# Patient Record
Sex: Female | Born: 2005 | Race: White | Hispanic: No | Marital: Married | Smoking: Never smoker
Health system: Southern US, Community
[De-identification: ages and names within clinical notes are randomized; demographics above are authoritative.]

## PROBLEM LIST (undated history)

## (undated) DIAGNOSIS — J45909 Unspecified asthma, uncomplicated: Secondary | ICD-10-CM

## (undated) DIAGNOSIS — M419 Scoliosis, unspecified: Secondary | ICD-10-CM

## (undated) DIAGNOSIS — H9190 Unspecified hearing loss, unspecified ear: Secondary | ICD-10-CM

## (undated) DIAGNOSIS — H6690 Otitis media, unspecified, unspecified ear: Secondary | ICD-10-CM

## (undated) DIAGNOSIS — IMO0001 Reserved for inherently not codable concepts without codable children: Secondary | ICD-10-CM

## (undated) DIAGNOSIS — H1010 Acute atopic conjunctivitis, unspecified eye: Secondary | ICD-10-CM

## (undated) DIAGNOSIS — O139 Gestational [pregnancy-induced] hypertension without significant proteinuria, unspecified trimester: Secondary | ICD-10-CM

## (undated) DIAGNOSIS — E039 Hypothyroidism, unspecified: Secondary | ICD-10-CM

## (undated) HISTORY — PX: TONSILLECTOMY: SUR1361

## (undated) HISTORY — PX: ADENOIDECTOMY: SUR15

## (undated) HISTORY — DX: Gestational (pregnancy-induced) hypertension without significant proteinuria, unspecified trimester: O13.9

## (undated) HISTORY — DX: Acute atopic conjunctivitis, unspecified eye: H10.10

---
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
## 2023-01-20 ENCOUNTER — Ambulatory Visit
Admission: EM | Admit: 2023-01-20 | Discharge: 2023-01-20 | Disposition: A | Discharge status: Home / Self Care | Payer: Medicaid Other | Attending: Physician Assistant | Admitting: Physician Assistant

## 2023-01-20 DIAGNOSIS — J039 Acute tonsillitis, unspecified: Secondary | ICD-10-CM

## 2023-01-20 DIAGNOSIS — J069 Acute upper respiratory infection, unspecified: Secondary | ICD-10-CM | POA: Diagnosis not present

## 2023-01-20 DIAGNOSIS — R040 Epistaxis: Secondary | ICD-10-CM | POA: Diagnosis not present

## 2023-01-20 MED ORDER — PREDNISONE 20 MG PO TABS: 40.0000 mg | ORAL_TABLET | Freq: Every day | ORAL | 0 refills | Status: AC

## 2023-01-20 NOTE — ED Triage Notes (Signed)
Pt presents with nasal drainage, non productive cough,sore throat, and intermittent nose bleeds X 2 days.

## 2023-01-21 ENCOUNTER — Encounter: Payer: Self-pay | Admitting: Physician Assistant

## 2023-01-21 NOTE — ED Provider Notes (Signed)
Shannon [name]    CSN: [id number] Arrival date & time: 01/20/23  1732      History   Chief Complaint Chief Complaint  Patient presents with   URI   Epistaxis    HPI Shannon [name] is a 17 y.o. female.   Patient here today for evaluation of nasal congestion and drainage, nonproductive cough, sore throat and intermittent nosebleeds that have been ongoing the last 2 days.  She reports she has had about 3 nosebleeds that have lasted about 5 minutes each.  She does not report fever.  She has not any vomiting or diarrhea.  She has tried over-the-counter medication without resolution of symptoms.  The history is provided by the patient.  URI Presenting symptoms: congestion, cough and sore throat   Presenting symptoms: no ear pain and no fever   Associated symptoms: no wheezing   Epistaxis Associated symptoms: congestion, cough and sore throat   Associated symptoms: no fever     Past Medical History:  Diagnosis Date   Allergic conjunctivitis 02/23/2018   Asthma    Hard of hearing    Hearing loss    Hypothyroidism    Impaired hearing    Otitis    Scoliosis     Patient Active Problem List   Diagnosis Date Noted   Visual changes 11/15/2020   Moderate headache 11/15/2020   Vacuum-assisted vaginal delivery 11/16 11/07/2020   Hypokalemia 11/07/2020   Anemia, iron deficiency 11/07/2020   Postpartum [name] following VAVD - 11/16 11/07/2020   Preeclampsia 11/06/2020   Mild persistent asthma 02/23/2018   Hearing impaired 12/09/2014    Past Surgical History:  Procedure Laterality Date   ADENOIDECTOMY     INCISION AND DRAINAGE ABSCESS Left 08/18/2014   Procedure: INCISION AND DRAINAGE ABSCESS - Left Arm;  Surgeon: Jerilynn Mages. Gerald Stabs, MD;  Location: [facility] [facility];  Service: Pediatrics;  Laterality: Left;   TONSILLECTOMY      OB History     Gravida  2   Para  1   Term  1   Preterm  0   AB  0   Living  1      SAB  0   IAB  0   Ectopic  0   Multiple       Live Births  1            Home Medications    Prior to Admission medications   Medication Sig Start Date End Date Taking? Authorizing Provider  predniSONE (DELTASONE) 20 MG tablet Take 2 tablets (40 mg total) by mouth daily with breakfast for 5 days. 01/20/23 01/25/23 Yes Francene Finders, PA-C  cyclobenzaprine (FLEXERIL) 5 MG tablet Take 5 mg by mouth 3 (three) times daily as needed. Patient not taking: Reported on 08/26/2022 05/13/22   [provider]  gabapentin (NEURONTIN) 300 MG capsule Take 300 mg by mouth 2 (two) times daily. 04/25/22   [provider]  hydrOXYzine (VISTARIL) 25 MG capsule Take 25 mg by mouth at bedtime as needed for anxiety (sleep). 07/29/22   [provider]  ibuprofen (ADVIL) 200 MG tablet Take 400 mg by mouth as needed (pain).    [provider]  levothyroxine (SYNTHROID) 50 MCG tablet Take 50 mcg by mouth daily. Patient not taking: Reported on 08/26/2022 06/03/22   [provider]  lidocaine (XYLOCAINE) 2 % solution Use as directed 15 mLs in the mouth or throat as needed for mouth pain. 11/10/22   Rising, Wells Guiles, PA-C  Multiple Vitamins-Minerals (MULTIVITAMIN WITH MINERALS) tablet Take 1 tablet by mouth daily.    [provider]  naproxen (NAPROSYN) 375 MG tablet Take 375 mg by mouth 2 (two) times daily as needed (pain). 06/28/22   [provider]  potassium chloride (KLOR-CON) 10 MEQ tablet Take 1 tablet (10 mEq total) by mouth daily. Patient not taking: Reported on 08/26/2022 10/22/20   Nugent, Gerrie Nordmann, NP  tiZANidine (ZANAFLEX) 2 MG tablet Take 2-4 mg by mouth every 8 (eight) hours as needed. Patient not taking: Reported on 08/26/2022 07/15/22   [provider]    Family History Family History  Problem Relation Age of Onset   Migraines Mother    Anxiety disorder Mother    Depression Mother    Migraines Brother    Autism Brother    ADD / ADHD Neg Hx    Bipolar disorder Neg Hx     Schizophrenia Neg Hx    Diabetes Maternal Grandmother    Hypertension Maternal Grandmother    Heart disease Maternal Grandmother    Learning disabilities Brother    Asthma Brother    Allergic rhinitis Brother    Learning disabilities Maternal Uncle    Mental illness Maternal Uncle    Mental retardation Maternal Uncle    Healthy Mother    Healthy Father    Eczema Neg Hx     Social History Social History   Tobacco Use   Smoking status: Never    Passive exposure: Yes   Smokeless tobacco: Never   Tobacco comments:    mom and step dad smokes outside  Vaping Use   Vaping Use: Never used  Substance Use Topics   Alcohol use: Never   Drug use: Never     Allergies   Eggs or egg-derived products   Review of Systems Review of Systems  Constitutional:  Negative for chills and fever.  HENT:  Positive for congestion, nosebleeds and sore throat. Negative for ear pain.   Eyes:  Negative for discharge and redness.  Respiratory:  Positive for cough. Negative for shortness of breath and wheezing.   Gastrointestinal:  Negative for abdominal pain, diarrhea, nausea and vomiting.     Physical Exam Triage Vital Signs ED Triage Vitals  Enc Vitals Group     BP 01/20/23 1821 110/78     Pulse Rate 01/20/23 1821 92     Resp 01/20/23 1821 17     Temp 01/20/23 1821 98.2 F (36.8 C)     Temp Source 01/20/23 1821 Oral     SpO2 01/20/23 1821 97 %     Weight --      Height --      Head Circumference --      Peak Flow --      Pain Score 01/20/23 1820 5     Pain Loc --      Pain Edu? --      Excl. in [place]? --    No data found.  Updated Vital Signs BP 110/78 (BP Location: Left Arm)   Pulse 92   Temp 98.2 F (36.8 C) (Oral)   Resp 17   SpO2 97%      Physical Exam Vitals and nursing note reviewed.  Constitutional:      General: She is not in acute distress.    Appearance: Normal appearance. She is not ill-appearing.  HENT:     Head: Normocephalic and atraumatic.     Nose:  Congestion present.     Mouth/Throat:  Mouth: Mucous membranes are moist.     Pharynx: Posterior oropharyngeal erythema present. No oropharyngeal exudate.     Tonsils: 3+ on the right. 3+ on the left.  Eyes:     Conjunctiva/sclera: Conjunctivae normal.  Cardiovascular:     Rate and Rhythm: Normal rate and regular rhythm.     Heart sounds: Normal heart sounds. No murmur heard. Pulmonary:     Effort: Pulmonary effort is normal. No respiratory distress.     Breath sounds: Normal breath sounds. No wheezing, rhonchi or rales.  Skin:    General: Skin is warm and dry.  Neurological:     Mental Status: She is alert.  Psychiatric:        Mood and Affect: Mood normal.        Thought Content: Thought content normal.      UC Treatments / Results  Labs (all labs ordered are listed, but only abnormal results are displayed) Labs Reviewed - No data to display  EKG   Radiology No results found.  Procedures Procedures (including critical [name] time)  Medications Ordered in UC Medications - No data to display  Initial Impression / Assessment and Plan / UC Course  I have reviewed the triage vital signs and the nursing notes.  Pertinent labs & imaging results that were available during my [name] of the patient were reviewed by me and considered in my medical decision making (see chart for details).    Suspect likely viral etiology of symptoms.  Tonsils are relatively large on exam, will treat with steroid burst to hopefully decrease inflammation and improve symptoms.  Discussed that that nosebleeds are likely due to sinus issues, seemingly benign at this time, recommended follow-up if they persist or worsen.  Encouraged symptomatic treatment otherwise for symptoms.  Final Clinical Impressions(s) / UC Diagnoses   Final diagnoses:  Acute upper respiratory infection  Acute tonsillitis, unspecified etiology  Epistaxis   Discharge Instructions   None    ED Prescriptions      Medication Sig Dispense Auth. Provider   predniSONE (DELTASONE) 20 MG tablet Take 2 tablets (40 mg total) by mouth daily with breakfast for 5 days. 10 tablet Francene Finders, PA-C      PDMP not reviewed this encounter.   Francene Finders, PA-C 01/21/23 [id number]

## 2023-04-09 IMAGING — CR DG CHEST 2V
2 series · 2 of 2 positions shown · non-contrast
Comparison: 08/28/2018

CLINICAL DATA: Chest pain and difficulty breathing

EXAM:
CHEST - 2 VIEW

[w chest pa]
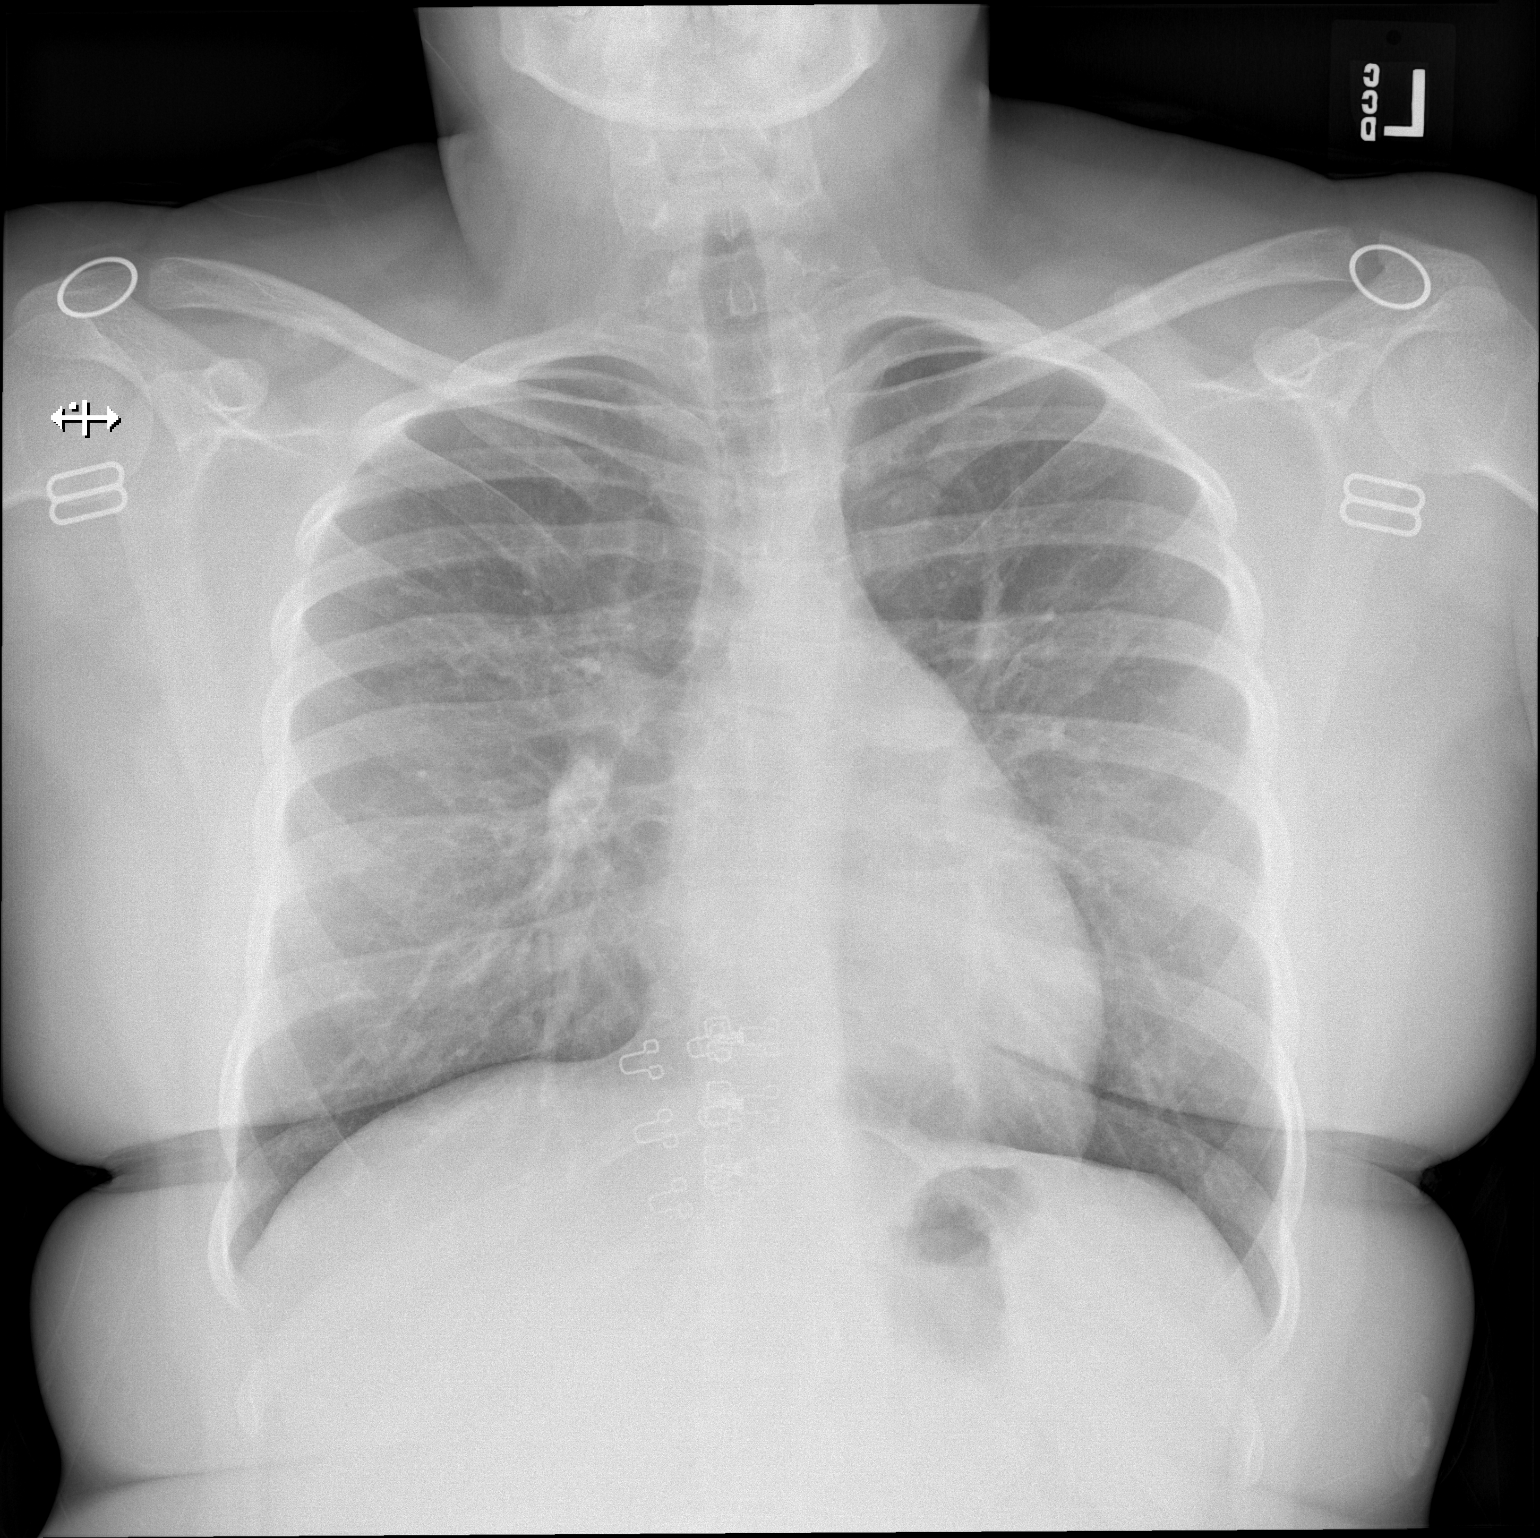

[w chest lat]
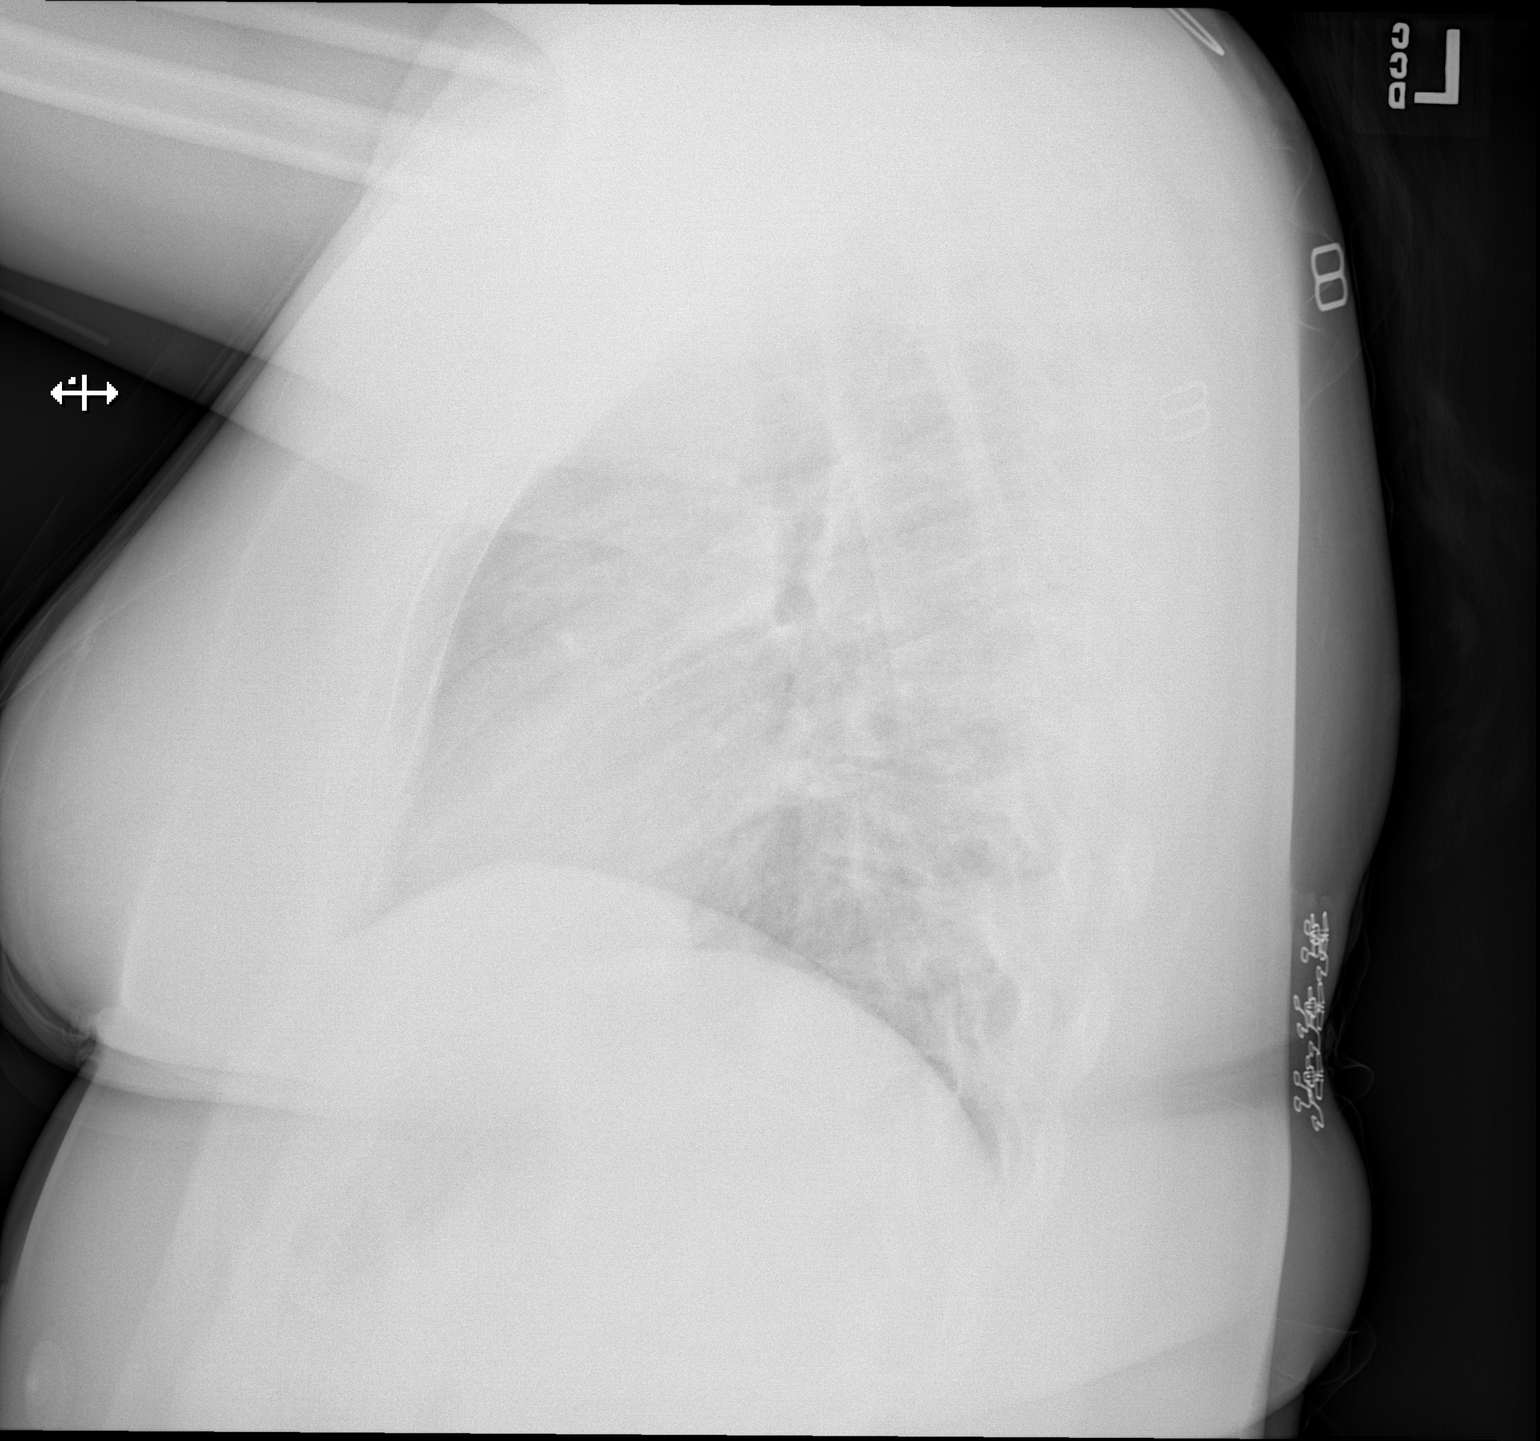

[2 of 2 positions shown; findings below may reference images not displayed]

FINDINGS: Artifact from EKG pads.

Normal heart size and mediastinal contours. No acute infiltrate or
edema. No effusion or pneumothorax. No acute osseous findings.
IMPRESSION: No active cardiopulmonary disease.

## 2023-05-02 ENCOUNTER — Encounter (HOSPITAL_BASED_OUTPATIENT_CLINIC_OR_DEPARTMENT_OTHER): Payer: Self-pay

## 2023-05-02 ENCOUNTER — Other Ambulatory Visit: Payer: Self-pay

## 2023-05-02 ENCOUNTER — Emergency Department (HOSPITAL_BASED_OUTPATIENT_CLINIC_OR_DEPARTMENT_OTHER): Payer: Medicaid Other

## 2023-05-02 ENCOUNTER — Emergency Department (HOSPITAL_BASED_OUTPATIENT_CLINIC_OR_DEPARTMENT_OTHER)
Admission: EM | Admit: 2023-05-02 | Discharge: 2023-05-02 | Disposition: A | Discharge status: Home / Self Care | Payer: Medicaid Other | Attending: Emergency Medicine | Admitting: Emergency Medicine

## 2023-05-02 DIAGNOSIS — S91209A Unspecified open wound of unspecified toe(s) with damage to nail, initial encounter: Secondary | ICD-10-CM | POA: Diagnosis not present

## 2023-05-02 DIAGNOSIS — Y9344 Activity, trampolining: Secondary | ICD-10-CM | POA: Insufficient documentation

## 2023-05-02 DIAGNOSIS — X501XXA Overexertion from prolonged static or awkward postures, initial encounter: Secondary | ICD-10-CM | POA: Insufficient documentation

## 2023-05-02 DIAGNOSIS — M79675 Pain in left toe(s): Secondary | ICD-10-CM | POA: Diagnosis present

## 2023-05-02 LAB — PREGNANCY, URINE: Preg Test, Ur: NEGATIVE

## 2023-05-02 MED ORDER — CEPHALEXIN 250 MG PO CAPS
500.0000 mg | ORAL_CAPSULE | Freq: Once | ORAL | Status: AC
  Administered 2023-05-02: 500 mg via ORAL
  Filled 2023-05-02: qty 2

## 2023-05-02 MED ORDER — CEPHALEXIN 500 MG PO CAPS: 500.0000 mg | ORAL_CAPSULE | Freq: Two times a day (BID) | ORAL | 0 refills | Status: DC

## 2023-05-02 NOTE — ED Provider Notes (Signed)
Shannon [name] Provider Note   CSN: [id number] Arrival date & time: 05/02/23  1610     History  Chief Complaint  Patient presents with   Toe Pain   Possible Pregnancy    [name] is a 17 y.o. female, no pertinent past medical history, who presents to the ED secondary to a left toe injury after jumping on trampoline, and pulling her toenail.  She states that her toe hurts a lot, and notes that the return is completely ripped off.  Up-to-date on immunizations.  Also states that she may be pregnant and would like to know.  Denies any kind of head trauma, neck trauma, or other pain.     Home Medications Prior to Admission medications   Medication Sig Start Date End Date Taking? Authorizing Provider  cephALEXin (KEFLEX) 500 MG capsule Take 1 capsule (500 mg total) by mouth 2 (two) times daily. 05/02/23  Yes Khalia Gong L, PA  cyclobenzaprine (FLEXERIL) 5 MG tablet Take 5 mg by mouth 3 (three) times daily as needed. Patient not taking: Reported on 08/26/2022 05/13/22   [provider]  gabapentin (NEURONTIN) 300 MG capsule Take 300 mg by mouth 2 (two) times daily. 04/25/22   [provider]  hydrOXYzine (VISTARIL) 25 MG capsule Take 25 mg by mouth at bedtime as needed for anxiety (sleep). 07/29/22   [provider]  ibuprofen (ADVIL) 200 MG tablet Take 400 mg by mouth as needed (pain).    [provider]  levothyroxine (SYNTHROID) 50 MCG tablet Take 50 mcg by mouth daily. Patient not taking: Reported on 08/26/2022 06/03/22   [provider]  lidocaine (XYLOCAINE) 2 % solution Use as directed 15 mLs in the mouth or throat as needed for mouth pain. 11/10/22   Rising, Lurena Joiner, PA-C  Multiple Vitamins-Minerals (MULTIVITAMIN WITH MINERALS) tablet Take 1 tablet by mouth daily.    [provider]  naproxen (NAPROSYN) 375 MG tablet Take 375 mg by mouth 2 (two) times daily as needed (pain). 06/28/22    [provider]  potassium chloride (KLOR-CON) 10 MEQ tablet Take 1 tablet (10 mEq total) by mouth daily. Patient not taking: Reported on 08/26/2022 10/22/20   Nugent, Odie Sera, NP  tiZANidine (ZANAFLEX) 2 MG tablet Take 2-4 mg by mouth every 8 (eight) hours as needed. Patient not taking: Reported on 08/26/2022 07/15/22   [provider]      Allergies    Egg-derived products    Review of Systems   Review of Systems  Musculoskeletal:  Negative for neck pain.       +L great toe pain    Physical Exam Updated Vital Signs BP (!) 147/92 (BP Location: Right Arm)   Pulse 93   Temp 98.5 F (36.9 C) (Oral)   Resp 18   Ht 4\' 9"  (1.448 m)   Wt 68 kg   SpO2 98%   BMI 32.46 kg/m  Physical Exam Vitals and nursing note reviewed.  Constitutional:      General: She is not in acute distress.    Appearance: She is well-developed.  HENT:     Head: Normocephalic and atraumatic.  Eyes:     Conjunctiva/sclera: Conjunctivae normal.  Cardiovascular:     Rate and Rhythm: Normal rate and regular rhythm.     Heart sounds: No murmur heard. Pulmonary:     Effort: Pulmonary effort is normal. No respiratory distress.     Breath sounds: Normal breath sounds.  Abdominal:     Palpations: Abdomen is soft.     Tenderness: There is no abdominal tenderness.  Musculoskeletal:        General: No swelling.     Cervical back: Neck supple.     Comments: Left foot: ttp of DIP of L great toe. No edema. Able to bear weight. No midfoot or base of 5th metatarsal tenderness to palpation.  DIPs, PIPs intact.  Capillary refill <2sec. Dorsalis pedis pulse present. No foot drop noted. Sensation intact. Warm to touch.    Skin:    General: Skin is warm and dry.     Capillary Refill: Capillary refill takes less than 2 seconds.     Comments: No evidence of nail on right great toe  Neurological:     Mental Status: She is alert.  Psychiatric:        Mood and Affect: Mood normal.     ED Results /  Procedures / Treatments   Labs (all labs ordered are listed, but only abnormal results are displayed) Labs Reviewed  PREGNANCY, URINE    EKG None  Radiology DG Toe Great Left  Result Date: 05/02/2023 CLINICAL DATA:  Injury to the left great toe 2 days ago EXAM: LEFT GREAT TOE COMPARISON:  None Available. FINDINGS: There is no evidence of fracture or dislocation. There is no evidence of arthropathy or other focal bone abnormality. Soft tissues are unremarkable. IMPRESSION: No acute fracture or dislocation. Electronically Signed   By: Agustin Cree M.D.   On: 05/02/2023 17:59    Procedures Procedures   Medications Ordered in ED Medications  cephALEXin (KEFLEX) capsule 500 mg (500 mg Oral Given 05/02/23 1709)    ED Course/ Medical Decision Making/ A&P                             Medical Decision Making Discussed with patient, she has no nail on her right great toe, we discussed possibly putting something underneath the nail, to help encourage it to grow back, and she declined.  She understands that her nail may not grow back, and is in agreement with plan.  She will need to be on some type of Keflex, to help with infection, and we discussed dressings.  She is requesting an x-ray to be completed of her toe, for further evaluation x-ray ordered.  No head trauma, or other complaints.  Pregnancy test is negative  Amount and/or Complexity of Data Reviewed Labs: ordered.    Details: Negative pregnancy test Radiology: ordered.    Details: X-ray unremarkable Discussion of management or test interpretation with external provider(s): Discussed with patient, x-ray unremarkable, we discussed possible treatment of the nail, and putting something underneath the nail also to promote it to grow back, she declined.  We will put her on Keflex for 10 days, to help with prevention of infection, we discussed infectious control, and return if there is development of redness, swelling, worsening pain.  X-ray  unremarkable.  Discharged home.  Negative pregnancy test.  No other trauma.  Risk Prescription drug management.   Final Clinical Impression(s) / ED Diagnoses Final diagnoses:  Toe pain, left  Avulsion of toenail, initial encounter    Rx / DC Orders ED Discharge Orders          Ordered    cephALEXin (KEFLEX) 500 MG capsule  2 times daily        05/02/23 1818  Pete Pelt, [place] 05/02/23 Katherine Basset, MD 05/02/23 [phone]

## 2023-05-02 NOTE — Discharge Instructions (Addendum)
Please take the antibiotic as prescribed, this will help prevent infection of the nail.  Keep the area clean and dry, and make sure you bandage it, with gauze, and tape.  If you do you develop redness, swelling, pain, that is worsening please return to the ER.  You can use Tylenol and ibuprofen for your pain control.

## 2023-05-02 NOTE — ED Triage Notes (Signed)
Pt states that she thinks she "messed up" her L big toe. Pt states that she was jumping on the trampoline and her toe bent back and her toenail popped off. Pt also states that she thinks she may be pregnant.

## 2023-06-19 ENCOUNTER — Ambulatory Visit (INDEPENDENT_AMBULATORY_CARE_PROVIDER_SITE_OTHER): Payer: Medicaid Other | Admitting: *Deleted

## 2023-06-19 ENCOUNTER — Other Ambulatory Visit (INDEPENDENT_AMBULATORY_CARE_PROVIDER_SITE_OTHER): Payer: Medicaid Other

## 2023-06-19 ENCOUNTER — Other Ambulatory Visit (HOSPITAL_COMMUNITY)
Admission: RE | Admit: 2023-06-19 | Discharge: 2023-06-19 | Disposition: A | Discharge status: Home / Self Care | Payer: Medicaid Other | Source: Ambulatory Visit | Attending: Obstetrics & Gynecology | Admitting: Obstetrics & Gynecology

## 2023-06-19 VITALS — BP 122/84 | HR 74 | Wt 153.6 lb

## 2023-06-19 DIAGNOSIS — Z3481 Encounter for supervision of other normal pregnancy, first trimester: Secondary | ICD-10-CM

## 2023-06-19 DIAGNOSIS — Z3480 Encounter for supervision of other normal pregnancy, unspecified trimester: Secondary | ICD-10-CM | POA: Insufficient documentation

## 2023-06-19 DIAGNOSIS — O3680X0 Pregnancy with inconclusive fetal viability, not applicable or unspecified: Secondary | ICD-10-CM

## 2023-06-19 DIAGNOSIS — Z3A09 9 weeks gestation of pregnancy: Secondary | ICD-10-CM | POA: Diagnosis not present

## 2023-06-19 DIAGNOSIS — Z1339 Encounter for screening examination for other mental health and behavioral disorders: Secondary | ICD-10-CM

## 2023-06-19 DIAGNOSIS — Z3A10 10 weeks gestation of pregnancy: Secondary | ICD-10-CM

## 2023-06-19 MED ORDER — PROMETHAZINE HCL 25 MG PO TABS: 25.0000 mg | ORAL_TABLET | Freq: Four times a day (QID) | ORAL | 1 refills | Status: DC | PRN

## 2023-06-19 NOTE — Progress Notes (Signed)
New OB Intake  I connected with Shannon [name]  on 06/19/23 at  1:10 PM EDT by In Person Visit and verified that I am speaking with the correct person using two identifiers. Nurse is located at [facility] and pt is located at [place].  I discussed the limitations, risks, security and privacy concerns of performing an evaluation and management service by telephone and the availability of in person appointments. I also discussed with the patient that there may be a patient responsible charge related to this service. The patient expressed understanding and agreed to proceed.  I explained I am completing New OB Intake today. We discussed EDD of 01/11/2024, by Last Menstrual Period. Pt is G2P1001. I reviewed her allergies, medications and Medical/Surgical/OB history.    Patient Active Problem List   Diagnosis Date Noted   Visual changes 11/15/2020   Moderate headache 11/15/2020   Vacuum-assisted vaginal delivery 11/16 11/07/2020   Hypokalemia 11/07/2020   Anemia, iron deficiency 11/07/2020   Postpartum care following VAVD - 11/16 11/07/2020   Preeclampsia 11/06/2020   Mild persistent asthma 02/23/2018   Hearing impaired 12/09/2014    Concerns addressed today  Delivery Plans Plans to deliver at [facility]. Discussed the nature of our practice with multiple providers including residents and students. Due to the size of the practice, the delivering provider may not be the same as those providing prenatal care.   Patient is interested in water birth. Offered upcoming OB visit with CNM to discuss further.  MyChart/Babyscripts MyChart access verified. I explained pt will have some visits in office and some virtually. Babyscripts instructions given and order placed. Patient verifies receipt of registration text/e-mail. Account successfully created and app downloaded.  Blood Pressure Cuff/Weight Scale Blood pressure cuff ordered for patient to pick-up from [employer]. Explained after first  prenatal appt pt will check weekly and document in Babyscripts. Patient does not have weight scale; patient may purchase if they desire to track weight weekly in Babyscripts.  Anatomy US Explained first scheduled [place] will be around 19 weeks. Anatomy US scheduled for TBD at TBD.  Interested in [place]? If yes, send referral and doula dot phrase.    First visit review I reviewed new OB appt with patient. Explained pt will be seen by Dr. Donavan Foil at first visit. Discussed Avelina Laine genetic screening with patient. Requests Panorama and Horizon.. Routine prenatal labs  collected at today's visit.    Last Pap No results found for: "DIAGPAP"  Shannon Lemon, RN 06/19/2023  1:43 PM

## 2023-06-19 NOTE — Patient Instructions (Signed)
[facility] has a partnership with the [employer] to provide prenatal navigation for the most needed resources in our community. In order to see how we can help connect you to these resources we need consent to contact you. Please complete the very short consent using the link below:   English Link: https://[email or web address].[email or web address]?[email or web address]  Spanish Link: https://[email or web address].tfaforms.[email or web address]?site=16   Options for Doula Care in the [place] Area  As you review your birthing options, consider having a birth doula. A doula is trained to provide support before, during and just after you give birth. There are also postpartum doulas that help you adjust to new parenthood.  While doulas do not provide medical care, they do provide emotional, physical and educational support. A few months before your baby arrives, doulas can help answer questions, ease concerns and help you create and support your birthing plan.    Doulas can help reduce your stress and comfort you and your partner. They can help you cope with labor by helping you use breathing techniques, massage, creative labor positioning, essential oils and affirmations.   Studies show that the benefits of having a doula include:   A more positive birth experience  Fewer requests for pain-relief medication  Less likelihood of cesarean section, commonly called a c-section   Doulas are typically hired via a fee and contract between you and the doula. We are happy to provide a list of the most active doulas in the area, all of whom are credentialed by [facility] and will not count as a visitor at your birth.  There are several options for no-cost doula care at our hospital, including:  [facility] Volunteer Doula Program Every Baby [place] Doula Program A Cure 4 Moms Doula Study (available only at [facility], [facility], [place] and [place] [facility] offices)  For more information on these programs or to receive  a list of doulas active in our area, please email [email or web address]         

## 2023-06-20 LAB — CBC/D/PLT+RPR+RH+ABO+RUBIGG...
Antibody Screen: NEGATIVE
Basophils Absolute: 0 10*3/uL (ref 0.0–0.2)
Basos: 0 %
EOS (ABSOLUTE): 0.1 10*3/uL (ref 0.0–0.4)
Eos: 1 %
HCV Ab: NONREACTIVE
HIV Screen 4th Generation wRfx: NONREACTIVE
Hematocrit: 39.6 % (ref 34.0–46.6)
Hemoglobin: 13.6 g/dL (ref 11.1–15.9)
Hepatitis B Surface Ag: NEGATIVE
Immature Grans (Abs): 0 10*3/uL (ref 0.0–0.1)
Immature Granulocytes: 0 %
Lymphocytes Absolute: 2 10*3/uL (ref 0.7–3.1)
Lymphs: 26 %
MCH: 29.2 pg (ref 26.6–33.0)
MCHC: 34.3 g/dL (ref 31.5–35.7)
MCV: 85 fL (ref 79–97)
Monocytes Absolute: 0.6 10*3/uL (ref 0.1–0.9)
Monocytes: 8 %
Neutrophils Absolute: 4.8 10*3/uL (ref 1.4–7.0)
Neutrophils: 65 %
Platelets: 259 10*3/uL (ref 150–450)
RBC: 4.66 x10E6/uL (ref 3.77–5.28)
RDW: 14.1 % (ref 11.7–15.4)
RPR Ser Ql: NONREACTIVE
Rh Factor: POSITIVE
Rubella Antibodies, IGG: 9.56 index (ref >0.99)
WBC: 7.5 10*3/uL (ref 3.4–10.8)

## 2023-06-20 LAB — COMPREHENSIVE METABOLIC PANEL
ALT: 10 IU/L (ref 0–32)
AST: 13 IU/L (ref 0–40)
Albumin: 4.6 g/dL (ref 4.0–5.0)
Alkaline Phosphatase: 94 IU/L (ref 42–106)
BUN/Creatinine Ratio: 21 (ref 9–23)
BUN: 9 mg/dL (ref 6–20)
Bilirubin Total: <0.2 mg/dL (ref 0.0–1.2)
CO2: 20 mmol/L (ref 20–29)
Calcium: 9.5 mg/dL (ref 8.7–10.2)
Chloride: 101 mmol/L (ref 96–106)
Creatinine, Ser: 0.42 mg/dL — ABNORMAL LOW (ref 0.57–1.00)
Globulin, Total: 2.9 g/dL (ref 1.5–4.5)
Glucose: 74 mg/dL (ref 70–99)
Potassium: 4 mmol/L (ref 3.5–5.2)
Sodium: 137 mmol/L (ref 134–144)
Total Protein: 7.5 g/dL (ref 6.0–8.5)
eGFR: 145 mL/min/{1.73_m2} (ref >59)

## 2023-06-20 LAB — HEMOGLOBIN A1C
Est. average glucose Bld gHb Est-mCnc: 97 mg/dL
Hgb A1c MFr Bld: 5 % (ref 4.8–5.6)

## 2023-06-20 LAB — CERVICOVAGINAL ANCILLARY ONLY
Chlamydia: NEGATIVE
Comment: NEGATIVE
Comment: NORMAL
Neisseria Gonorrhea: NEGATIVE

## 2023-06-20 LAB — HCV INTERPRETATION

## 2023-06-21 LAB — CULTURE, OB URINE

## 2023-06-21 LAB — URINE CULTURE, OB REFLEX

## 2023-06-26 LAB — PANORAMA PRENATAL TEST FULL PANEL:PANORAMA TEST PLUS 5 ADDITIONAL MICRODELETIONS: FETAL FRACTION: 8

## 2023-07-03 ENCOUNTER — Ambulatory Visit (INDEPENDENT_AMBULATORY_CARE_PROVIDER_SITE_OTHER): Payer: Medicaid Other | Admitting: Licensed Clinical Social Worker

## 2023-07-03 ENCOUNTER — Ambulatory Visit (INDEPENDENT_AMBULATORY_CARE_PROVIDER_SITE_OTHER): Payer: Medicaid Other | Admitting: Obstetrics and Gynecology

## 2023-07-03 VITALS — BP 114/78 | HR 89 | Wt 151.0 lb

## 2023-07-03 DIAGNOSIS — M549 Dorsalgia, unspecified: Secondary | ICD-10-CM

## 2023-07-03 DIAGNOSIS — O99340 Other mental disorders complicating pregnancy, unspecified trimester: Secondary | ICD-10-CM

## 2023-07-03 DIAGNOSIS — E039 Hypothyroidism, unspecified: Secondary | ICD-10-CM | POA: Diagnosis not present

## 2023-07-03 DIAGNOSIS — O99891 Other specified diseases and conditions complicating pregnancy: Secondary | ICD-10-CM

## 2023-07-03 DIAGNOSIS — O09299 Supervision of pregnancy with other poor reproductive or obstetric history, unspecified trimester: Secondary | ICD-10-CM | POA: Insufficient documentation

## 2023-07-03 DIAGNOSIS — O09291 Supervision of pregnancy with other poor reproductive or obstetric history, first trimester: Secondary | ICD-10-CM | POA: Diagnosis not present

## 2023-07-03 DIAGNOSIS — F419 Anxiety disorder, unspecified: Secondary | ICD-10-CM

## 2023-07-03 DIAGNOSIS — Z3A12 12 weeks gestation of pregnancy: Secondary | ICD-10-CM

## 2023-07-03 DIAGNOSIS — J4530 Mild persistent asthma, uncomplicated: Secondary | ICD-10-CM

## 2023-07-03 DIAGNOSIS — Z3480 Encounter for supervision of other normal pregnancy, unspecified trimester: Secondary | ICD-10-CM

## 2023-07-03 MED ORDER — CYCLOBENZAPRINE HCL 5 MG PO TABS: 5.0000 mg | ORAL_TABLET | Freq: Three times a day (TID) | ORAL | 1 refills | Status: DC | PRN

## 2023-07-03 MED ORDER — ASPIRIN 81 MG PO CHEW: 81.0000 mg | CHEWABLE_TABLET | Freq: Every day | ORAL | 5 refills | Status: DC

## 2023-07-03 NOTE — Progress Notes (Signed)
INITIAL PRENATAL VISIT NOTE  Subjective:  Shannon [name] is a 17 y.o. G2P1001 at [redacted]w[redacted]d by LMP being seen today for her initial prenatal visit. She has an obstetric history significant for Vacuum assisted vaginal delivery and preeclampsia. She has a medical history significant for hypothyroid.  Patient reports  chronic back pain from scoliosis .  Contractions: Not present. Vag. Bleeding: None.   . Denies leaking of fluid.    Past Medical History:  Diagnosis Date   Allergic conjunctivitis 02/23/2018   Asthma    Hard of hearing    Hearing loss    Hypothyroidism    Impaired hearing    Otitis    Scoliosis     Past Surgical History:  Procedure Laterality Date   ADENOIDECTOMY     INCISION AND DRAINAGE ABSCESS Left 08/18/2014   Procedure: INCISION AND DRAINAGE ABSCESS - Left Arm;  Surgeon: Judie Petit. [name], MD;  Location: [facility] OR;  Service: Pediatrics;  Laterality: Left;   TONSILLECTOMY      OB History  Gravida Para Term Preterm AB Living  2 1 1  0 0 1  SAB IAB Ectopic Multiple Live Births  0 0 0   1    # Outcome Date GA Lbr Len/2nd Weight Sex Type Anes PTL Lv  2 Current           1 Term 11/07/20 [redacted]w[redacted]d / 06:28 6 lb 8.9 oz (2.975 kg) F Vag-Vacuum EPI  LIV    Social History   Socioeconomic History   Marital status: Married    Spouse name: Not on file   Number of children: Not on file   Years of education: Not on file   Highest education level: Not on file  Occupational History   Not on file  Tobacco Use   Smoking status: Never    Passive exposure: Yes   Smokeless tobacco: Never   Tobacco comments:    mom and step dad smokes outside  Vaping Use   Vaping status: Never Used  Substance and Sexual Activity   Alcohol use: Never   Drug use: Never   Sexual activity: Yes    Birth control/protection: None  Other Topics Concern   Not on file  Social History Narrative   ** Merged History Encounter **       Lives with mom and 1 brother. She is in the 9th grade but not  currently in school   Social Determinants of Health   Financial Resource Strain: Not on file  Food Insecurity: Not on file  Transportation Needs: Not on file  Physical Activity: Not on file  Stress: Not on file  Social Connections: Not on file    Family History  Problem Relation Age of Onset   Migraines Mother    Anxiety disorder Mother    Depression Mother    Migraines Brother    Autism Brother    ADD / ADHD Neg Hx    Bipolar disorder Neg Hx    Schizophrenia Neg Hx    Diabetes Maternal Grandmother    Hypertension Maternal Grandmother    Heart disease Maternal Grandmother    Learning disabilities Brother    Asthma Brother    Allergic rhinitis Brother    Learning disabilities Maternal Uncle    Mental illness Maternal Uncle    Mental retardation Maternal Uncle    Healthy Mother    Healthy Father    Eczema Neg Hx      Current Outpatient Medications:  levothyroxine (SYNTHROID) 50 MCG tablet, Take 50 mcg by mouth daily., Disp: , Rfl:    Prenatal Vit-Fe Fumarate-FA (PRENATAL VITAMIN PO), Take 1 tablet by mouth daily., Disp: , Rfl:    promethazine (PHENERGAN) 25 MG tablet, Take 1 tablet (25 mg total) by mouth every 6 (six) hours as needed for nausea or vomiting., Disp: 30 tablet, Rfl: 1   cephALEXin (KEFLEX) 500 MG capsule, Take 1 capsule (500 mg total) by mouth 2 (two) times daily., Disp: 20 capsule, Rfl: 0   cyclobenzaprine (FLEXERIL) 5 MG tablet, Take 5 mg by mouth 3 (three) times daily as needed., Disp: , Rfl:    gabapentin (NEURONTIN) 300 MG capsule, Take 300 mg by mouth 2 (two) times daily., Disp: , Rfl:    hydrOXYzine (VISTARIL) 25 MG capsule, Take 25 mg by mouth at bedtime as needed for anxiety (sleep)., Disp: , Rfl:    Multiple Vitamins-Minerals (MULTIVITAMIN WITH MINERALS) tablet, Take 1 tablet by mouth daily., Disp: , Rfl:    naproxen (NAPROSYN) 375 MG tablet, Take 375 mg by mouth 2 (two) times daily as needed (pain)., Disp: , Rfl:    potassium chloride (KLOR-CON)  10 MEQ tablet, Take 1 tablet (10 mEq total) by mouth daily., Disp: 5 tablet, Rfl: 0  Allergies  Allergen Reactions   Egg-Derived Products Other (See Comments)    Severe ab pain    Review of Systems: Negative except for what is mentioned in HPI.  Objective:   Vitals:   07/03/23 1320  BP: 114/78  Pulse: 89  Weight: 151 lb (68.5 kg)    Fetal Status: Fetal Heart Rate (bpm): 165         Physical Exam: BP 114/78   Pulse 89   Wt 151 lb (68.5 kg)   LMP 04/06/2023 Comment: pt reports normal for her to have irregular periods  BMI 32.68 kg/m  CONSTITUTIONAL: Well-developed, well-nourished female in no acute distress.  NEUROLOGIC: Alert and oriented to person, place, and time. Normal reflexes, muscle tone coordination. No cranial nerve deficit noted. PSYCHIATRIC: Normal mood and affect. Normal behavior. Normal judgment and thought content. SKIN: Skin is warm and dry. No rash noted. Not diaphoretic. No erythema. No pallor. HENT:  Normocephalic, atraumatic, External right and left ear normal. Oropharynx is clear and moist EYES: Conjunctivae and EOM are normal.  NECK: Normal range of motion, supple, no masses CARDIOVASCULAR: Normal heart rate noted, regular rhythm RESPIRATORY: Effort and breath sounds normal, no problems with respiration noted BREASTS: defer ABDOMEN: Soft, nontender, nondistended, gravid. MUSCULOSKELETAL: Normal range of motion. EXT:  No edema and no tenderness. 2+ distal pulses.   Assessment and Plan:  Pregnancy: G2P1001 at [redacted]w[redacted]d by LMP  1. Supervision of other normal pregnancy, antepartum Continuen routine prenatal care  2. [redacted] weeks gestation of pregnancy   3. Mild persistent asthma without complication   4. Hx of preeclampsia, prior pregnancy, currently pregnant Rx for baby ASA, start at 15 weeks  5. History of delivery by vacuum extraction, currently pregnant Monitor fetal growth  6. Hypothyroidism, unspecified type Pt uses synthroid Will check  thyroid panel next visit   Preterm labor symptoms and general obstetric precautions including but not limited to vaginal bleeding, contractions, leaking of fluid and fetal movement were reviewed in detail with the patient.  Please refer to After Visit Summary for other counseling recommendations.   Return in about 4 weeks (around 07/31/2023) for ROB, in person.  Warden Fillers 07/03/2023 2:29 PM

## 2023-07-03 NOTE — Patient Instructions (Signed)

## 2023-07-03 NOTE — BH Specialist Note (Signed)
Integrated Behavioral Health Initial In-Person Visit  MRN: [id number] Name: Shannon [name]  Number of Integrated Behavioral Health Clinician visits: 1 Session Start time: 2:00pm   Session End time: 2:33pm Total time in minutes: 33 mins in person at [facility]   Types of Service: Individual psychotherapy  Interpretor:No. Interpretor Name and Language: none   Warm Hand Off Completed.        Subjective: Shannon [name] is a 17 y.o. female accompanied by [facility] Patient was referred by Dr. Donavan Foil for anxiety affecting pregnancy. Patient reports the following symptoms/concerns: anxiety and back pain due to scoliosis according to patient  Duration of problem: approx 2 years ; Severity of problem: mild  Objective: Mood: Anxious and Affect: Appropriate Risk of harm to self or others: No plan to harm self or others  Life Context: Family and Social: Lives with family in [place]  School/Work: Resigned from [place] and [place] approx two month ago according to Pt.  Will go back to get GED  Self-Care: n/a Life Changes: New pregnancy  Patient and/or Family's Strengths/Protective Factors: Concrete supports in place (healthy food, safe environments, etc.)  Goals Addressed: Patient will: Reduce symptoms of: anxiety Increase knowledge and/or ability of: coping skills  Demonstrate ability to: Increase healthy adjustment to current life circumstances  Progress towards Goals: Ongoing  Interventions: Interventions utilized: Supportive Counseling  Standardized Assessments completed: PHQ 9  Patient and/or Family Response: Ms. Trego reports anxiety symptoms increase due to fast pace labor process with last pregnancy.   Assessment: Patient currently experiencing anxiety associated with pregnancy.   Patient may benefit from integrated behavioral health.  Plan: Follow up with behavioral health clinician on : as needed  Behavioral recommendations: prioritize rest, delegate task to  prevent burnout, mindfulness and relaxation technique  Referral(s): Integrated [employer] (In Clinic) "From scale of 1-10, how likely are you to follow plan?":    Shannon Saxon, LCSW

## 2023-07-31 ENCOUNTER — Ambulatory Visit (INDEPENDENT_AMBULATORY_CARE_PROVIDER_SITE_OTHER): Payer: Medicaid Other | Admitting: Obstetrics and Gynecology

## 2023-07-31 VITALS — BP 121/79 | HR 86 | Wt 149.0 lb

## 2023-07-31 DIAGNOSIS — E039 Hypothyroidism, unspecified: Secondary | ICD-10-CM

## 2023-07-31 DIAGNOSIS — O219 Vomiting of pregnancy, unspecified: Secondary | ICD-10-CM

## 2023-07-31 DIAGNOSIS — Z3480 Encounter for supervision of other normal pregnancy, unspecified trimester: Secondary | ICD-10-CM

## 2023-07-31 DIAGNOSIS — O09299 Supervision of pregnancy with other poor reproductive or obstetric history, unspecified trimester: Secondary | ICD-10-CM

## 2023-07-31 DIAGNOSIS — O09292 Supervision of pregnancy with other poor reproductive or obstetric history, second trimester: Secondary | ICD-10-CM

## 2023-07-31 DIAGNOSIS — Z3A16 16 weeks gestation of pregnancy: Secondary | ICD-10-CM

## 2023-07-31 MED ORDER — PROMETHAZINE HCL 25 MG PO TABS: 25.0000 mg | ORAL_TABLET | Freq: Four times a day (QID) | ORAL | 2 refills | Status: AC | PRN

## 2023-07-31 NOTE — Progress Notes (Signed)
   PRENATAL VISIT NOTE  Subjective:  Shannon [name] is a 17 y.o. G2P1001 at [redacted]w[redacted]d being seen today for ongoing prenatal care.  She is currently monitored for the following issues for this low-risk pregnancy and has Hearing impaired; Mild persistent asthma; Preeclampsia; Vacuum-assisted vaginal delivery 11/16; Hypokalemia; Anemia, iron deficiency; Postpartum care following VAVD - 11/16; Visual changes; Moderate headache; Supervision of other normal pregnancy, antepartum; Hx of preeclampsia, prior pregnancy, currently pregnant; History of delivery by vacuum extraction, currently pregnant; and Hypothyroidism on their problem list.  Patient doing well with no acute concerns today. She reports no complaints.  Contractions: Not present. Vag. Bleeding: None.  Movement: Present. Denies leaking of fluid.   The following portions of the patient's history were reviewed and updated as appropriate: allergies, current medications, past family history, past medical history, past social history, past surgical history and problem list. Problem list updated.  Objective:   Vitals:   07/31/23 1346  BP: 121/79  Pulse: 86  Weight: 149 lb (67.6 kg)    Fetal Status: Fetal Heart Rate (bpm): 155 Fundal Height: 16 cm Movement: Present     General:  Alert, oriented and cooperative. Patient is in no acute distress.  Skin: Skin is warm and dry. No rash noted.   Cardiovascular: Normal heart rate noted  Respiratory: Normal respiratory effort, no problems with respiration noted  Abdomen: Soft, gravid, appropriate for gestational age.  Pain/Pressure: Absent     Pelvic: Cervical exam deferred        Extremities: Normal range of motion.     Mental Status:  Normal mood and affect. Normal behavior. Normal judgment and thought content.   Assessment and Plan:  Pregnancy: G2P1001 at [redacted]w[redacted]d  1. [redacted] weeks gestation of pregnancy   2. Hx of preeclampsia, prior pregnancy, currently pregnant Baby ASA prophylaxis  3.  History of delivery by vacuum extraction, currently pregnant   4. Supervision of other normal pregnancy, antepartum Continue routine prenatal Rx for phenergan sent  - AFP, Serum, Open Spina Bifida  5. Hypothyroidism, unspecified type Will recheck thyroid studies  - Thyroid Panel With TSH  Preterm labor symptoms and general obstetric precautions including but not limited to vaginal bleeding, contractions, leaking of fluid and fetal movement were reviewed in detail with the patient.  Please refer to After Visit Summary for other counseling recommendations.   Return in about 4 weeks (around 08/28/2023) for ROB, in person.   Mariel Aloe, MD Faculty Attending Center for [facility]

## 2023-08-18 ENCOUNTER — Ambulatory Visit: Payer: Medicaid Other | Admitting: *Deleted

## 2023-08-18 ENCOUNTER — Encounter: Payer: Self-pay | Admitting: *Deleted

## 2023-08-18 ENCOUNTER — Ambulatory Visit: Payer: Medicaid Other | Attending: Obstetrics & Gynecology

## 2023-08-18 ENCOUNTER — Other Ambulatory Visit: Payer: Self-pay | Admitting: *Deleted

## 2023-08-18 ENCOUNTER — Ambulatory Visit (HOSPITAL_BASED_OUTPATIENT_CLINIC_OR_DEPARTMENT_OTHER): Payer: Medicaid Other | Admitting: Obstetrics

## 2023-08-18 VITALS — BP 124/72 | HR 90

## 2023-08-18 DIAGNOSIS — O99282 Endocrine, nutritional and metabolic diseases complicating pregnancy, second trimester: Secondary | ICD-10-CM | POA: Insufficient documentation

## 2023-08-18 DIAGNOSIS — O09292 Supervision of pregnancy with other poor reproductive or obstetric history, second trimester: Secondary | ICD-10-CM | POA: Diagnosis not present

## 2023-08-18 DIAGNOSIS — Z362 Encounter for other antenatal screening follow-up: Secondary | ICD-10-CM

## 2023-08-18 DIAGNOSIS — O99212 Obesity complicating pregnancy, second trimester: Secondary | ICD-10-CM | POA: Diagnosis present

## 2023-08-18 DIAGNOSIS — O365920 Maternal care for other known or suspected poor fetal growth, second trimester, not applicable or unspecified: Secondary | ICD-10-CM | POA: Diagnosis present

## 2023-08-18 DIAGNOSIS — Z3480 Encounter for supervision of other normal pregnancy, unspecified trimester: Secondary | ICD-10-CM

## 2023-08-18 DIAGNOSIS — E039 Hypothyroidism, unspecified: Secondary | ICD-10-CM | POA: Insufficient documentation

## 2023-08-18 DIAGNOSIS — Z3A19 19 weeks gestation of pregnancy: Secondary | ICD-10-CM | POA: Diagnosis not present

## 2023-08-18 DIAGNOSIS — J45909 Unspecified asthma, uncomplicated: Secondary | ICD-10-CM | POA: Insufficient documentation

## 2023-08-18 DIAGNOSIS — O99512 Diseases of the respiratory system complicating pregnancy, second trimester: Secondary | ICD-10-CM | POA: Diagnosis not present

## 2023-08-18 NOTE — Progress Notes (Signed)
MFM Note  Shannon [name] was seen for a detailed fetal anatomy scan due to maternal obesity and hypothyroidism.  She is currently at 19 weeks and 1 day.  She has a history of preeclampsia in her prior pregnancy.  She had a cell free DNA test earlier in her pregnancy which indicated a low risk for trisomy 36, 42, and 13. A female fetus is predicted.  Her MSAFP was 2.46 MoM.  On today's exam, the overall EFW measures at the 1st percentile for her gestational age.  Her EDC of January 11, 2024 was confirmed via a first trimester ultrasound.  There were no obvious fetal anomalies noted on today's ultrasound exam.  However, today's exam was limited due to the smaller fetal size.  Her posterior/fundal placenta was globular in appearance.  The patient was informed that anomalies may be missed due to technical limitations. If the fetus is in a suboptimal position or maternal habitus is increased, visualization of the fetus in the maternal uterus may be impaired.  The patient was advised that the smaller fetal size noted today may be related to genetics as she is only 4-1/2 feet tall.    However based on the appearance of the placenta and her borderline elevated MSAFP, there is a possibility that the fetus may be IUGR later in her pregnancy due to placental dysfunction.  The increased risk of recurrent preeclampsia based on her history and the increased risk of preeclampsia associated with placental dysfunction was discussed.    She was advised to start taking 2 tablets of baby aspirin (81 mg each) daily for preeclampsia prophylaxis.    We will continue to follow her with growth ultrasounds throughout her pregnancy.    She understands that should IUGR be suspected later in her pregnancy, frequent fetal testing and an earlier delivery may be recommended.    The patient and her partner stated that all of their questions were answered today.    A follow-up exam was scheduled in 4 weeks.  A total of  45 minutes was spent counseling and coordinating the care for this patient.  Greater than 50% of the time was spent in direct face-to-face contact.

## 2023-08-28 ENCOUNTER — Ambulatory Visit: Payer: Medicaid Other | Admitting: Obstetrics & Gynecology

## 2023-08-28 VITALS — BP 137/84 | HR 50 | Wt 152.0 lb

## 2023-08-28 DIAGNOSIS — Z3480 Encounter for supervision of other normal pregnancy, unspecified trimester: Secondary | ICD-10-CM

## 2023-08-28 DIAGNOSIS — Z3A20 20 weeks gestation of pregnancy: Secondary | ICD-10-CM

## 2023-08-28 DIAGNOSIS — O09299 Supervision of pregnancy with other poor reproductive or obstetric history, unspecified trimester: Secondary | ICD-10-CM

## 2023-08-28 DIAGNOSIS — M419 Scoliosis, unspecified: Secondary | ICD-10-CM

## 2023-08-28 DIAGNOSIS — O09292 Supervision of pregnancy with other poor reproductive or obstetric history, second trimester: Secondary | ICD-10-CM

## 2023-08-28 MED ORDER — GABAPENTIN 600 MG PO TABS: 300.0000 mg | ORAL_TABLET | Freq: Two times a day (BID) | ORAL | 2 refills | Status: DC

## 2023-08-28 NOTE — Progress Notes (Signed)
Pt would like to discuss muscle relaxer, pt states Flexeril isn't working well for her. Pt states she is now taking 2 low dose ASA per MFM.  Growth was at 1%tile.  Pt has elevated BP today - no other symptoms.

## 2023-08-28 NOTE — Progress Notes (Signed)
   PRENATAL VISIT NOTE  Subjective:  Shannon [name] is a 17 y.o. G2P1001 at [redacted]w[redacted]d being seen today for ongoing prenatal care.  She is currently monitored for the following issues for this high-risk pregnancy and has Hearing impaired; Mild persistent asthma; Supervision of other normal pregnancy, antepartum; Hx of preeclampsia, prior pregnancy, currently pregnant; History of delivery by vacuum extraction, currently pregnant; Hypothyroidism; and Scoliosis on their problem list.  Patient reports backache.  Contractions: Not present. Vag. Bleeding: None.  Movement: Present. Denies leaking of fluid.   The following portions of the patient's history were reviewed and updated as appropriate: allergies, current medications, past family history, past medical history, past social history, past surgical history and problem list.   Objective:   Vitals:   08/28/23 1344 08/28/23 1353 08/28/23 1425  BP: (!) 144/90 (!) 132/90 137/84  Pulse: (!) 120 (!) 105 (!) 50  Weight: 152 lb (68.9 kg)      Fetal Status: Fetal Heart Rate (bpm): 150   Movement: Present     General:  Alert, oriented and cooperative. Patient is in no acute distress.  Skin: Skin is warm and dry. No rash noted.   Cardiovascular: Normal heart rate noted  Respiratory: Normal respiratory effort, no problems with respiration noted  Abdomen: Soft, gravid, appropriate for gestational age.  Pain/Pressure: Absent     Pelvic: Cervical exam deferred        Extremities: Normal range of motion.     Mental Status: Normal mood and affect. Normal behavior. Normal judgment and thought content.   Assessment and Plan:  Pregnancy: G2P1001 at [redacted]w[redacted]d 1. Hx of preeclampsia, prior pregnancy, currently pregnant Follow BP  2. Supervision of other normal pregnancy, antepartum   3. History of delivery by vacuum extraction, currently pregnant   4. Scoliosis, unspecified scoliosis type, unspecified spinal region Long H/O back pain and this is not well  controlled now. She wil return to [facility] and will resume PT as needed. I restarted gabapentin to see if she will have any relief. Flexeril is not helping - gabapentin (NEURONTIN) 600 MG tablet; Take 0.5 tablets (300 mg total) by mouth 2 (two) times daily.  Dispense: 30 tablet; Refill: 2  Preterm labor symptoms and general obstetric precautions including but not limited to vaginal bleeding, contractions, leaking of fluid and fetal movement were reviewed in detail with the patient. Please refer to After Visit Summary for other counseling recommendations.   Return in about 4 weeks (around 09/25/2023).  Future Appointments  Date Time Provider Department Center  09/15/2023  1:15 PM [facility] NURSE [facility]  09/15/2023  1:30 PM [facility]  09/25/2023 11:15 AM Raelyn Mora, CNM [facility] None    Scheryl Darter, MD

## 2023-08-30 ENCOUNTER — Encounter: Payer: Self-pay | Admitting: Obstetrics & Gynecology

## 2023-08-31 ENCOUNTER — Other Ambulatory Visit: Payer: Self-pay | Admitting: Obstetrics & Gynecology

## 2023-08-31 ENCOUNTER — Encounter: Payer: Self-pay | Admitting: Obstetrics & Gynecology

## 2023-08-31 DIAGNOSIS — O132 Gestational [pregnancy-induced] hypertension without significant proteinuria, second trimester: Secondary | ICD-10-CM

## 2023-08-31 DIAGNOSIS — O139 Gestational [pregnancy-induced] hypertension without significant proteinuria, unspecified trimester: Secondary | ICD-10-CM | POA: Insufficient documentation

## 2023-08-31 DIAGNOSIS — O1410 Severe pre-eclampsia, unspecified trimester: Secondary | ICD-10-CM | POA: Insufficient documentation

## 2023-08-31 MED ORDER — NIFEDIPINE ER OSMOTIC RELEASE 30 MG PO TB24: 30.0000 mg | ORAL_TABLET | Freq: Every day | ORAL | 2 refills | Status: DC

## 2023-08-31 NOTE — Progress Notes (Signed)
Please refer to patient message for further details about initiating this antihypertensive therapy for presumed GHTN, pending evaluation for preeclampsia.  Jaynie Collins, MD

## 2023-09-02 ENCOUNTER — Other Ambulatory Visit: Payer: Medicaid Other

## 2023-09-02 DIAGNOSIS — O09299 Supervision of pregnancy with other poor reproductive or obstetric history, unspecified trimester: Secondary | ICD-10-CM

## 2023-09-03 LAB — COMPREHENSIVE METABOLIC PANEL
ALT: 5 IU/L (ref 0–32)
AST: 12 IU/L (ref 0–40)
Albumin: 3.8 g/dL — ABNORMAL LOW (ref 4.0–5.0)
Alkaline Phosphatase: 103 IU/L (ref 42–106)
BUN/Creatinine Ratio: 22 (ref 9–23)
BUN: 8 mg/dL (ref 6–20)
Bilirubin Total: <0.2 mg/dL (ref 0.0–1.2)
CO2: 18 mmol/L — ABNORMAL LOW (ref 20–29)
Calcium: 8.8 mg/dL (ref 8.7–10.2)
Chloride: 105 mmol/L (ref 96–106)
Creatinine, Ser: 0.36 mg/dL — ABNORMAL LOW (ref 0.57–1.00)
Globulin, Total: 2.6 g/dL (ref 1.5–4.5)
Glucose: 84 mg/dL (ref 70–99)
Potassium: 3.9 mmol/L (ref 3.5–5.2)
Sodium: 138 mmol/L (ref 134–144)
Total Protein: 6.4 g/dL (ref 6.0–8.5)
eGFR: 150 mL/min/{1.73_m2} (ref >59)

## 2023-09-03 LAB — CBC
Hematocrit: 37.1 % (ref 34.0–46.6)
Hemoglobin: 12.1 g/dL (ref 11.1–15.9)
MCH: 28.9 pg (ref 26.6–33.0)
MCHC: 32.6 g/dL (ref 31.5–35.7)
MCV: 89 fL (ref 79–97)
Platelets: 273 10*3/uL (ref 150–450)
RBC: 4.19 x10E6/uL (ref 3.77–5.28)
RDW: 14.3 % (ref 11.7–15.4)
WBC: 6.9 10*3/uL (ref 3.4–10.8)

## 2023-09-04 LAB — PROTEIN / CREATININE RATIO, URINE
Creatinine, Urine: 78.2 mg/dL
Protein, Ur: 16.9 mg/dL
Protein/Creat Ratio: 216 mg/g{creat} — ABNORMAL HIGH (ref 0–200)

## 2023-09-06 ENCOUNTER — Encounter: Payer: Self-pay | Admitting: Obstetrics and Gynecology

## 2023-09-06 DIAGNOSIS — O365990 Maternal care for other known or suspected poor fetal growth, unspecified trimester, not applicable or unspecified: Secondary | ICD-10-CM | POA: Insufficient documentation

## 2023-09-08 ENCOUNTER — Ambulatory Visit (INDEPENDENT_AMBULATORY_CARE_PROVIDER_SITE_OTHER): Payer: Medicaid Other

## 2023-09-08 VITALS — BP 127/86 | HR 83

## 2023-09-08 DIAGNOSIS — E039 Hypothyroidism, unspecified: Secondary | ICD-10-CM | POA: Diagnosis not present

## 2023-09-08 DIAGNOSIS — Z0130 Encounter for examination of blood pressure without abnormal findings: Secondary | ICD-10-CM | POA: Diagnosis not present

## 2023-09-08 NOTE — Progress Notes (Signed)
Subjective:  Shannon [name] is a 17 y.o. female here for BP check.   Hypertension ROS: taking medications as instructed, no medication side effects noted, no TIA's, no chest pain on exertion, no dyspnea on exertion, and no swelling of ankles. Denies visual disturbances. Does complain of some swelling in her right hand.   Objective:  LMP 04/06/2023 (Exact Date) Comment: pt reports normal for her to have irregular periods  Appearance alert, well appearing, and in no distress. General exam BP noted to be well controlled today in office.    Assessment:   Blood Pressure stable.   Plan:  Current treatment plan is effective, no change in therapy..   Thyroid Panel drawn per Dr. Para March

## 2023-09-09 ENCOUNTER — Encounter: Payer: Self-pay | Admitting: Obstetrics and Gynecology

## 2023-09-09 ENCOUNTER — Other Ambulatory Visit: Payer: Self-pay | Admitting: Obstetrics and Gynecology

## 2023-09-09 LAB — THYROID PANEL
Free Thyroxine Index: 0.2 — ABNORMAL LOW (ref 1.2–4.9)
T3 Uptake Ratio: 2 % — ABNORMAL LOW (ref 24–39)
T4, Total: 11.7 ug/dL (ref 4.5–12.0)

## 2023-09-10 ENCOUNTER — Other Ambulatory Visit: Payer: Self-pay

## 2023-09-10 DIAGNOSIS — E039 Hypothyroidism, unspecified: Secondary | ICD-10-CM

## 2023-09-11 ENCOUNTER — Telehealth: Payer: Self-pay

## 2023-09-11 ENCOUNTER — Other Ambulatory Visit: Payer: Medicaid Other

## 2023-09-11 NOTE — Telephone Encounter (Signed)
Called Shannon [name] to discuss additional labs needed. Shannon [name] scheduled for lab visit for additional labs.  We were able to add some labs on to recent blood work, but some of the others required Shannon [name] to come back in for a draw.  Shannon [name] not able to come back until Monday.  Also followed up on elevated BP reading reported in [facility].   Shannon [name] BP at time of call is 163/96. Shannon [name] states that she did have some spotty vision yesterday which resolved on its own. Denies headaches,but continues to have swelling in her hands.   States that she is only experiencing swelling in hands today.  Advised Shannon [name] to go to [facility] for PreE evaluation. Shannon [name] states that she is not able to get to [facility] or to be evaluated until Monday.   Expressed concern for continued increase in BP associated with visual changes. Strongly advised that she try to be evaluated, and she states that she will not be able to go anywhere until Monday.

## 2023-09-15 ENCOUNTER — Other Ambulatory Visit: Payer: Self-pay | Admitting: Obstetrics

## 2023-09-15 ENCOUNTER — Ambulatory Visit: Payer: Medicaid Other | Admitting: *Deleted

## 2023-09-15 ENCOUNTER — Encounter: Payer: Self-pay | Admitting: *Deleted

## 2023-09-15 ENCOUNTER — Ambulatory Visit: Payer: Medicaid Other | Attending: Obstetrics

## 2023-09-15 ENCOUNTER — Other Ambulatory Visit: Payer: Medicaid Other

## 2023-09-15 ENCOUNTER — Other Ambulatory Visit: Payer: Self-pay | Admitting: *Deleted

## 2023-09-15 VITALS — BP 126/70 | HR 93

## 2023-09-15 DIAGNOSIS — E039 Hypothyroidism, unspecified: Secondary | ICD-10-CM | POA: Insufficient documentation

## 2023-09-15 DIAGNOSIS — O99282 Endocrine, nutritional and metabolic diseases complicating pregnancy, second trimester: Secondary | ICD-10-CM | POA: Insufficient documentation

## 2023-09-15 DIAGNOSIS — E669 Obesity, unspecified: Secondary | ICD-10-CM

## 2023-09-15 DIAGNOSIS — O365920 Maternal care for other known or suspected poor fetal growth, second trimester, not applicable or unspecified: Secondary | ICD-10-CM

## 2023-09-15 DIAGNOSIS — Z362 Encounter for other antenatal screening follow-up: Secondary | ICD-10-CM

## 2023-09-15 DIAGNOSIS — O09292 Supervision of pregnancy with other poor reproductive or obstetric history, second trimester: Secondary | ICD-10-CM | POA: Insufficient documentation

## 2023-09-15 DIAGNOSIS — O99513 Diseases of the respiratory system complicating pregnancy, third trimester: Secondary | ICD-10-CM | POA: Insufficient documentation

## 2023-09-15 DIAGNOSIS — Z3A23 23 weeks gestation of pregnancy: Secondary | ICD-10-CM | POA: Diagnosis not present

## 2023-09-15 DIAGNOSIS — Z3480 Encounter for supervision of other normal pregnancy, unspecified trimester: Secondary | ICD-10-CM | POA: Insufficient documentation

## 2023-09-15 DIAGNOSIS — J45909 Unspecified asthma, uncomplicated: Secondary | ICD-10-CM | POA: Diagnosis not present

## 2023-09-15 DIAGNOSIS — O99212 Obesity complicating pregnancy, second trimester: Secondary | ICD-10-CM | POA: Insufficient documentation

## 2023-09-15 DIAGNOSIS — O99512 Diseases of the respiratory system complicating pregnancy, second trimester: Secondary | ICD-10-CM

## 2023-09-17 LAB — TSH: TSH: 0.382 u[IU]/mL — ABNORMAL LOW (ref 0.450–4.500)

## 2023-09-17 LAB — THYROID STIMULATING IMMUNOGLOBULIN: Thyroid Stim Immunoglobulin: <0.1 IU/L (ref 0.00–0.55)

## 2023-09-17 LAB — T4, FREE: Free T4: 0.91 ng/dL — ABNORMAL LOW (ref 0.93–1.60)

## 2023-09-17 LAB — THYROTROPIN RECEPTOR AUTOABS: Thyrotropin Receptor Ab: <1.1 IU/L (ref 0.00–1.75)

## 2023-09-25 ENCOUNTER — Encounter: Payer: Self-pay | Admitting: Obstetrics and Gynecology

## 2023-09-25 ENCOUNTER — Ambulatory Visit: Payer: Medicaid Other | Admitting: Obstetrics and Gynecology

## 2023-09-25 VITALS — BP 131/84 | HR 85 | Wt 156.4 lb

## 2023-09-25 DIAGNOSIS — O132 Gestational [pregnancy-induced] hypertension without significant proteinuria, second trimester: Secondary | ICD-10-CM

## 2023-09-25 DIAGNOSIS — Z3480 Encounter for supervision of other normal pregnancy, unspecified trimester: Secondary | ICD-10-CM

## 2023-09-25 DIAGNOSIS — Z3A24 24 weeks gestation of pregnancy: Secondary | ICD-10-CM

## 2023-09-25 NOTE — Progress Notes (Signed)
Pt presents for ROB visit. No concerns.  

## 2023-09-25 NOTE — Progress Notes (Signed)
  HIGH-RISK PREGNANCY OFFICE VISIT Patient name: Shannon [name] MRN [id number]  Date of birth: Aug 16, 2006 Chief Complaint:   Routine Prenatal Visit  History of Present Illness:   ADRIYANNA [name] is a 17 y.o. G44P1001 female at [redacted]w[redacted]d with an Estimated Date of Delivery: 01/11/24 being seen today for ongoing management of a high-risk pregnancy complicated by gestational hypertension currently on no meds Today she reports no complaints. Contractions: Not present. Vag. Bleeding: None.  Movement: Present. denies leaking of fluid.  Review of Systems:   Pertinent items are noted in HPI Denies abnormal vaginal discharge w/ itching/odor/irritation, headaches, visual changes, shortness of breath, chest pain, abdominal pain, severe nausea/vomiting, or problems with urination or bowel movements unless otherwise stated above. Pertinent History Reviewed:  Reviewed past medical,surgical, social, obstetrical and family history.  Reviewed problem list, medications and allergies. Physical Assessment:   Vitals:   09/25/23 1119  BP: 131/84  Pulse: 85  Weight: 156 lb 6.4 oz (70.9 kg)  Body mass index is 33.84 kg/m.           Physical Examination:   General appearance: alert, well appearing, and in no distress and oriented to person, place, and time  Mental status: alert, oriented to person, place, and time, normal mood, behavior, speech, dress, motor activity, and thought processes  Skin: warm & dry   Extremities: Edema: Trace    Cardiovascular: normal heart rate noted  Respiratory: normal respiratory effort, no distress  Abdomen: gravid, soft, non-tender  Pelvic: Cervical exam deferred         Fetal Status: Fetal Heart Rate (bpm): 147 Fundal Height: 25 cm Movement: Present    Fetal Surveillance Testing today: none   No results found for this or any previous visit (from the past 24 hour(s)).  Assessment & Plan:  1) High-risk pregnancy G2P1001 at [redacted]w[redacted]d with an Estimated Date of Delivery: 01/11/24    2) Supervision of other normal pregnancy, antepartum   3) Gestational hypertension, second trimester - On no meds, BPs not severe range  4) Hypothyroidism in Pregnancy - Recheck levels end of October  5) [redacted] weeks gestation of pregnancy   Meds: No orders of the defined types were placed in this encounter.   Labs/procedures today:   Treatment Plan:  none  Reviewed: Preterm labor symptoms and general obstetric precautions including but not limited to vaginal bleeding, contractions, leaking of fluid and fetal movement were reviewed in detail with the patient.  All questions were answered. Has home bp cuff. Check bp weekly, let us know if >140/90.   Follow-up: Return in about 4 weeks (around 10/23/2023) for Return OB 2hr GTT.  No orders of the defined types were placed in this encounter.  Raelyn Mora MSN, CNM 09/25/2023 11:48 AM

## 2023-09-25 NOTE — Patient Instructions (Signed)
You are to have NOTHING TO EAT (except for water) after midnight. You will have fasting blood drawn, drink the glucola drink (flavor choices: orange or fruit punch), have a visit with a provider during the first hour of testing, wait in the lab waiting room to have blood drawn at 1 hour and then 2 hours after finishing glucola drink.    Oral Glucose Tolerance Test During Pregnancy Why am I having this test? The oral glucose tolerance test (OGTT) is done to check how your body processes blood sugar (glucose). This is one of several tests used to diagnose diabetes that develops during pregnancy (gestational diabetes mellitus). Gestational diabetes is a short-term form of diabetes that some women develop while they are pregnant. It usually occurs during the second trimester of pregnancy and goes away after delivery. Testing, or screening, for gestational diabetes usually occurs at weeks 24-28 of pregnancy. You may have the OGTT test after having a 1-hour glucose screening test if the results from that test indicate that you may have gestational diabetes. This test may also be needed if: You have a history of gestational diabetes. There is a history of giving birth to very large babies or of losing pregnancies (having stillbirths). You have signs and symptoms of diabetes, such as: Changes in your eyesight. Tingling or numbness in your hands or feet. Changes in hunger, thirst, and urination, and these are not explained by your pregnancy. What is being tested? This test measures the amount of glucose in your blood at different times during a period of 3 hours. This shows how well your body can process glucose. What kind of sample is taken?  Blood samples are required for this test. They are usually collected by inserting a needle into a blood vessel. How do I prepare for this test? For 3 days before your test, eat normally. Have plenty of carbohydrate-rich foods. Follow instructions from your health  care provider about: Eating or drinking restrictions on the day of the test. You may be asked not to eat or drink anything other than water (to fast) starting 8-10 hours before the test. Changing or stopping your regular medicines. Some medicines may interfere with this test. Tell a health care provider about: All medicines you are taking, including vitamins, herbs, eye drops, creams, and over-the-counter medicines. Any blood disorders you have. Any surgeries you have had. Any medical conditions you have. What happens during the test? First, your blood glucose will be measured. This is referred to as your fasting blood glucose because you fasted before the test. Then, you will drink a glucose solution that contains a certain amount of glucose. Your blood glucose will be measured again 1, 2, and 3 hours after you drink the solution. This test takes about 3 hours to complete. You will need to stay at the testing location during this time. During the testing period: Do not eat or drink anything other than the glucose solution. Do not exercise. Do not use any products that contain nicotine or tobacco, such as cigarettes, e-cigarettes, and chewing tobacco. These can affect your test results. If you need help quitting, ask your health care provider. The testing procedure may vary among health care providers and hospitals. How are the results reported? Your results will be reported as milligrams of glucose per deciliter of blood (mg/dL) or millimoles per liter (mmol/L). There is more than one source for screening and diagnosis reference values used to diagnose gestational diabetes. Your health care provider will compare your results  to normal values that were established after testing a large group of people (reference values). Reference values may vary among labs and hospitals. For this test (Carpenter-Coustan), reference values are: Fasting: 95 mg/dL (5.3 mmol/L). 1 hour: 180 mg/dL (98.1 mmol/L). 2  hour: 155 mg/dL (8.6 mmol/L). 3 hour: 140 mg/dL (7.8 mmol/L). What do the results mean? Results below the reference values are considered normal. If two or more of your blood glucose levels are at or above the reference values, you may be diagnosed with gestational diabetes. If only one level is high, your health care provider may suggest repeat testing or other tests to confirm a diagnosis. Talk with your health care provider about what your results mean. Questions to ask your health care provider Ask your health care provider, or the department that is doing the test: When will my results be ready? How will I get my results? What are my treatment options? What other tests do I need? What are my next steps? Summary The oral glucose tolerance test (OGTT) is one of several tests used to diagnose diabetes that develops during pregnancy (gestational diabetes mellitus). Gestational diabetes is a short-term form of diabetes that some women develop while they are pregnant. You may have the OGTT test after having a 1-hour glucose screening test if the results from that test show that you may have gestational diabetes. You may also have this test if you have any symptoms or risk factors for this type of diabetes. Talk with your health care provider about what your results mean. This information is not intended to replace advice given to you by your health care provider. Make sure you discuss any questions you have with your health care provider. Document Revised: 07/16/2022 Document Reviewed: 05/18/2020 Elsevier Patient Education  2024 [employer].

## 2023-09-29 ENCOUNTER — Encounter: Payer: Self-pay | Admitting: *Deleted

## 2023-09-29 ENCOUNTER — Ambulatory Visit: Payer: Medicaid Other | Attending: Maternal & Fetal Medicine

## 2023-09-29 ENCOUNTER — Ambulatory Visit: Payer: Medicaid Other | Admitting: *Deleted

## 2023-09-29 ENCOUNTER — Other Ambulatory Visit: Payer: Self-pay | Admitting: *Deleted

## 2023-09-29 VITALS — BP 126/83 | HR 75

## 2023-09-29 DIAGNOSIS — O99212 Obesity complicating pregnancy, second trimester: Secondary | ICD-10-CM | POA: Insufficient documentation

## 2023-09-29 DIAGNOSIS — O99512 Diseases of the respiratory system complicating pregnancy, second trimester: Secondary | ICD-10-CM | POA: Diagnosis not present

## 2023-09-29 DIAGNOSIS — O365990 Maternal care for other known or suspected poor fetal growth, unspecified trimester, not applicable or unspecified: Secondary | ICD-10-CM

## 2023-09-29 DIAGNOSIS — J45909 Unspecified asthma, uncomplicated: Secondary | ICD-10-CM | POA: Insufficient documentation

## 2023-09-29 DIAGNOSIS — Z362 Encounter for other antenatal screening follow-up: Secondary | ICD-10-CM | POA: Insufficient documentation

## 2023-09-29 DIAGNOSIS — Z3A25 25 weeks gestation of pregnancy: Secondary | ICD-10-CM | POA: Diagnosis not present

## 2023-09-29 DIAGNOSIS — O365920 Maternal care for other known or suspected poor fetal growth, second trimester, not applicable or unspecified: Secondary | ICD-10-CM | POA: Insufficient documentation

## 2023-09-29 DIAGNOSIS — Z3480 Encounter for supervision of other normal pregnancy, unspecified trimester: Secondary | ICD-10-CM | POA: Insufficient documentation

## 2023-09-29 DIAGNOSIS — E039 Hypothyroidism, unspecified: Secondary | ICD-10-CM | POA: Diagnosis not present

## 2023-09-29 DIAGNOSIS — E669 Obesity, unspecified: Secondary | ICD-10-CM

## 2023-09-29 DIAGNOSIS — O99282 Endocrine, nutritional and metabolic diseases complicating pregnancy, second trimester: Secondary | ICD-10-CM | POA: Insufficient documentation

## 2023-09-29 DIAGNOSIS — O09292 Supervision of pregnancy with other poor reproductive or obstetric history, second trimester: Secondary | ICD-10-CM | POA: Insufficient documentation

## 2023-10-08 ENCOUNTER — Ambulatory Visit: Payer: Medicaid Other | Admitting: *Deleted

## 2023-10-08 ENCOUNTER — Other Ambulatory Visit: Payer: Self-pay

## 2023-10-08 ENCOUNTER — Ambulatory Visit: Payer: Medicaid Other | Attending: Obstetrics

## 2023-10-08 VITALS — BP 124/63 | HR 80

## 2023-10-08 DIAGNOSIS — O99512 Diseases of the respiratory system complicating pregnancy, second trimester: Secondary | ICD-10-CM | POA: Insufficient documentation

## 2023-10-08 DIAGNOSIS — O99212 Obesity complicating pregnancy, second trimester: Secondary | ICD-10-CM | POA: Diagnosis not present

## 2023-10-08 DIAGNOSIS — O365990 Maternal care for other known or suspected poor fetal growth, unspecified trimester, not applicable or unspecified: Secondary | ICD-10-CM | POA: Diagnosis present

## 2023-10-08 DIAGNOSIS — H9190 Unspecified hearing loss, unspecified ear: Secondary | ICD-10-CM

## 2023-10-08 DIAGNOSIS — O09292 Supervision of pregnancy with other poor reproductive or obstetric history, second trimester: Secondary | ICD-10-CM | POA: Diagnosis not present

## 2023-10-08 DIAGNOSIS — O365920 Maternal care for other known or suspected poor fetal growth, second trimester, not applicable or unspecified: Secondary | ICD-10-CM | POA: Diagnosis not present

## 2023-10-08 DIAGNOSIS — Z3A26 26 weeks gestation of pregnancy: Secondary | ICD-10-CM | POA: Insufficient documentation

## 2023-10-08 DIAGNOSIS — O1492 Unspecified pre-eclampsia, second trimester: Secondary | ICD-10-CM | POA: Insufficient documentation

## 2023-10-08 DIAGNOSIS — Z3480 Encounter for supervision of other normal pregnancy, unspecified trimester: Secondary | ICD-10-CM | POA: Insufficient documentation

## 2023-10-08 DIAGNOSIS — O99891 Other specified diseases and conditions complicating pregnancy: Secondary | ICD-10-CM

## 2023-10-08 DIAGNOSIS — O132 Gestational [pregnancy-induced] hypertension without significant proteinuria, second trimester: Secondary | ICD-10-CM | POA: Diagnosis present

## 2023-10-08 DIAGNOSIS — J45909 Unspecified asthma, uncomplicated: Secondary | ICD-10-CM | POA: Insufficient documentation

## 2023-10-08 DIAGNOSIS — O99282 Endocrine, nutritional and metabolic diseases complicating pregnancy, second trimester: Secondary | ICD-10-CM | POA: Diagnosis not present

## 2023-10-08 DIAGNOSIS — Z362 Encounter for other antenatal screening follow-up: Secondary | ICD-10-CM | POA: Diagnosis not present

## 2023-10-08 DIAGNOSIS — E039 Hypothyroidism, unspecified: Secondary | ICD-10-CM | POA: Insufficient documentation

## 2023-10-08 DIAGNOSIS — E669 Obesity, unspecified: Secondary | ICD-10-CM

## 2023-10-09 ENCOUNTER — Other Ambulatory Visit: Payer: Self-pay | Admitting: *Deleted

## 2023-10-09 DIAGNOSIS — O365920 Maternal care for other known or suspected poor fetal growth, second trimester, not applicable or unspecified: Secondary | ICD-10-CM

## 2023-10-09 DIAGNOSIS — O132 Gestational [pregnancy-induced] hypertension without significant proteinuria, second trimester: Secondary | ICD-10-CM

## 2023-10-10 ENCOUNTER — Emergency Department (HOSPITAL_COMMUNITY)
Admission: EM | Admit: 2023-10-10 | Discharge: 2023-10-11 | Disposition: A | Discharge status: Home / Self Care | Payer: Medicaid Other | Attending: Emergency Medicine | Admitting: Emergency Medicine

## 2023-10-10 DIAGNOSIS — O4702 False labor before 37 completed weeks of gestation, second trimester: Secondary | ICD-10-CM | POA: Insufficient documentation

## 2023-10-10 DIAGNOSIS — R109 Unspecified abdominal pain: Secondary | ICD-10-CM | POA: Diagnosis not present

## 2023-10-10 DIAGNOSIS — O26892 Other specified pregnancy related conditions, second trimester: Secondary | ICD-10-CM | POA: Insufficient documentation

## 2023-10-10 DIAGNOSIS — Z3A26 26 weeks gestation of pregnancy: Secondary | ICD-10-CM | POA: Diagnosis not present

## 2023-10-10 DIAGNOSIS — Z3689 Encounter for other specified antenatal screening: Secondary | ICD-10-CM

## 2023-10-10 DIAGNOSIS — O4700 False labor before 37 completed weeks of gestation, unspecified trimester: Secondary | ICD-10-CM

## 2023-10-10 DIAGNOSIS — Z3A27 27 weeks gestation of pregnancy: Secondary | ICD-10-CM | POA: Insufficient documentation

## 2023-10-10 NOTE — ED Notes (Signed)
Rapid OB called  

## 2023-10-10 NOTE — ED Provider Notes (Signed)
[place] EMERGENCY DEPARTMENT AT [facility] Provider Note   CSN: [id number] Arrival date & time: 10/10/23  2243     History {Add pertinent medical, surgical, social history, OB history to HPI:1} Chief Complaint  Patient presents with   Abdominal Cramping   Pregnant -27 weeks     Shannon [name] is a 17 y.o. female.  The history is provided by the patient and medical records.  Abdominal Cramping Pertinent negatives include no abdominal pain.   17 year old G2, P1 approximately [redacted]w[redacted]d presenting to the ED with cramping.  This began yesterday evening around 8 PM, seemed a little random and not irregular intervals.  Today around 5 PM started becoming more consistent, mother has been timing this and seems to occur about every 2 minutes or so.  She has not had any bleeding or loss of fluids.  She is still feeling fetal movement.  Home Medications Prior to Admission medications   Medication Sig Start Date End Date Taking? Authorizing Provider  aspirin 81 MG chewable tablet Chew 1 tablet (81 mg total) by mouth daily. Start at 15 weeks 07/03/23   Warden Fillers, MD  levothyroxine (SYNTHROID) 50 MCG tablet Take 50 mcg by mouth daily. 06/03/22   [provider]  NIFEdipine (PROCARDIA-XL/NIFEDICAL-XL) 30 MG 24 hr tablet Take 1 tablet (30 mg total) by mouth daily. 08/31/23   Anyanwu, Jethro Bastos, MD  pregabalin (LYRICA) 25 MG capsule Take 25 mg by mouth 2 (two) times daily.    [provider]  Prenatal Vit-Fe Fumarate-FA (PRENATAL VITAMIN PO) Take 1 tablet by mouth daily.    [provider]  promethazine (PHENERGAN) 25 MG tablet Take 1 tablet (25 mg total) by mouth every 6 (six) hours as needed for nausea or vomiting. 07/31/23   Warden Fillers, MD      Allergies    Egg-derived products    Review of Systems   Review of Systems  Gastrointestinal:  Negative for abdominal pain.  All other systems reviewed and are negative.   Physical Exam Updated Vital  Signs BP 135/81 (BP Location: Left Arm)   Pulse 86   Temp 98.5 F (36.9 C) (Oral)   Resp 18   LMP 04/06/2023 (Exact Date) Comment: pt reports normal for her to have irregular periods  SpO2 97%   Physical Exam Vitals and nursing note reviewed.  Constitutional:      Appearance: She is well-developed.  HENT:     Head: Normocephalic and atraumatic.  Eyes:     Conjunctiva/sclera: Conjunctivae normal.     Pupils: Pupils are equal, round, and reactive to light.  Cardiovascular:     Rate and Rhythm: Normal rate and regular rhythm.     Heart sounds: Normal heart sounds.  Pulmonary:     Effort: Pulmonary effort is normal.     Breath sounds: Normal breath sounds.  Abdominal:     General: Bowel sounds are normal.     Palpations: Abdomen is soft.     Comments: Gravid abdomen, fetal movement felt  Musculoskeletal:        General: Normal range of motion.     Cervical back: Normal range of motion.  Skin:    General: Skin is warm and dry.  Neurological:     Mental Status: She is alert and oriented to person, place, and time.     ED Results / Procedures / Treatments   Labs (all labs ordered are listed, but only abnormal results are displayed) Labs Reviewed -  No data to display  EKG None  Radiology No results found.  Procedures Procedures  {Document cardiac monitor, telemetry assessment procedure when appropriate:1}  Medications Ordered in ED Medications - No data to display  ED Course/ Medical Decision Making/ A&P   {   Click here for ABCD2, HEART and other calculatorsREFRESH Note before signing :1}                              Medical Decision Making  17 year old G2P1 at [redacted]w[redacted]d, here with abdominal cramping.  Vitals are stable.  She does have fetal movement on my exam.  She has not had any bleeding or loss of fluid.  Patient placed on toco, rapid OB has been called.  Final Clinical Impression(s) / ED Diagnoses Final diagnoses:  None    Rx / DC Orders ED Discharge  Orders     None

## 2023-10-10 NOTE — ED Triage Notes (Signed)
Patient here from home reporting cramping 2 min apart.  No bleeding or leaking of fluids.

## 2023-10-10 NOTE — ED Notes (Signed)
OB Rapid Response called@23 :09pm.

## 2023-10-11 ENCOUNTER — Encounter (HOSPITAL_COMMUNITY): Payer: Self-pay | Admitting: Obstetrics & Gynecology

## 2023-10-11 ENCOUNTER — Other Ambulatory Visit: Payer: Self-pay

## 2023-10-11 DIAGNOSIS — Z3A26 26 weeks gestation of pregnancy: Secondary | ICD-10-CM

## 2023-10-11 DIAGNOSIS — O4700 False labor before 37 completed weeks of gestation, unspecified trimester: Secondary | ICD-10-CM

## 2023-10-11 DIAGNOSIS — O26892 Other specified pregnancy related conditions, second trimester: Secondary | ICD-10-CM | POA: Diagnosis present

## 2023-10-11 DIAGNOSIS — R109 Unspecified abdominal pain: Secondary | ICD-10-CM | POA: Diagnosis not present

## 2023-10-11 DIAGNOSIS — O4702 False labor before 37 completed weeks of gestation, second trimester: Secondary | ICD-10-CM | POA: Diagnosis not present

## 2023-10-11 DIAGNOSIS — Z3A27 27 weeks gestation of pregnancy: Secondary | ICD-10-CM | POA: Diagnosis not present

## 2023-10-11 LAB — WET PREP, GENITAL
Clue Cells Wet Prep HPF POC: NONE SEEN
Sperm: NONE SEEN
Trich, Wet Prep: NONE SEEN
WBC, Wet Prep HPF POC: <10 (ref <10)
Yeast Wet Prep HPF POC: NONE SEEN

## 2023-10-11 LAB — URINALYSIS, ROUTINE W REFLEX MICROSCOPIC
Bilirubin Urine: NEGATIVE
Glucose, UA: NEGATIVE mg/dL
Hgb urine dipstick: NEGATIVE
Ketones, ur: NEGATIVE mg/dL
Leukocytes,Ua: NEGATIVE
Nitrite: NEGATIVE
Protein, ur: NEGATIVE mg/dL
Specific Gravity, Urine: 1.017 (ref 1.005–1.030)
pH: 6 (ref 5.0–8.0)

## 2023-10-11 LAB — FETAL FIBRONECTIN: Fetal Fibronectin: NEGATIVE

## 2023-10-11 NOTE — MAU Provider Note (Signed)
Chief Complaint:  Abdominal Cramping and Pregnant -27 weeks   HPI   Event Date/Time   First Provider Initiated Contact with Patient 10/11/23 0317     Shannon [name] is a 17 y.o. G2P1001 at [redacted]w[redacted]d who presents to maternity admissions reporting cramping that began yesterday evening around 7pm that has gotten progressively worse. Present[facility] to [facility] and was transferred to [facility]. Denies vaginal bleeding, malodorous discharge or LOF. Feeling regular fetal movement. No other physical complaints.   Pregnancy Course: Receives care at [facility], prenatal records reviewed.  Past Medical History:  Diagnosis Date   Allergic conjunctivitis 02/23/2018   Asthma    Hard of hearing    Hearing loss    Hypothyroidism    Impaired hearing    Otitis    Scoliosis    OB History  Gravida Para Term Preterm AB Living  2 1 1  0 0 1  SAB IAB Ectopic Multiple Live Births  0 0 0   1    # Outcome Date GA Lbr Len/2nd Weight Sex Type Anes PTL Lv  2 Current           1 Term 11/07/20 [redacted]w[redacted]d / 06:28 6 lb 8.9 oz (2.975 kg) F Vag-Vacuum EPI  LIV   Past Surgical History:  Procedure Laterality Date   ADENOIDECTOMY     INCISION AND DRAINAGE ABSCESS Left 08/18/2014   Procedure: INCISION AND DRAINAGE ABSCESS - Left Arm;  Surgeon: Judie Petit. [name], MD;  Location: [facility] OR;  Service: Pediatrics;  Laterality: Left;   TONSILLECTOMY     Family History  Problem Relation Age of Onset   Migraines Mother    Anxiety disorder Mother    Depression Mother    Migraines Brother    Autism Brother    ADD / ADHD Neg Hx    Bipolar disorder Neg Hx    Schizophrenia Neg Hx    Diabetes Maternal Grandmother    Hypertension Maternal Grandmother    Heart disease Maternal Grandmother    Learning disabilities Brother    Asthma Brother    Allergic rhinitis Brother    Learning disabilities Maternal Uncle    Mental illness Maternal Uncle    Mental retardation Maternal Uncle    Healthy Mother    Healthy Father    Eczema Neg Hx     Social History   Tobacco Use   Smoking status: Never    Passive exposure: Yes   Smokeless tobacco: Never   Tobacco comments:    mom and step dad smokes outside  Vaping Use   Vaping status: Never Used  Substance Use Topics   Alcohol use: Never   Drug use: Never   Allergies  Allergen Reactions   Egg-Derived Products Other (See Comments)    Severe ab pain  Other Reaction(s): Other (See Comments)   Medications Prior to Admission  Medication Sig Dispense Refill Last Dose   levothyroxine (SYNTHROID) 50 MCG tablet Take 50 mcg by mouth daily.   10/10/2023 at 1100   NIFEdipine (PROCARDIA-XL/NIFEDICAL-XL) 30 MG 24 hr tablet Take 1 tablet (30 mg total) by mouth daily. 30 tablet 2 10/10/2023 at 1100   pregabalin (LYRICA) 25 MG capsule Take 25 mg by mouth 2 (two) times daily.   10/10/2023 at 1800   aspirin 81 MG chewable tablet Chew 1 tablet (81 mg total) by mouth daily. Start at 15 weeks 30 tablet 5    Prenatal Vit-Fe Fumarate-FA (PRENATAL VITAMIN PO) Take 1 tablet by mouth daily.  promethazine (PHENERGAN) 25 MG tablet Take 1 tablet (25 mg total) by mouth every 6 (six) hours as needed for nausea or vomiting. 30 tablet 2    I have reviewed patient's Past Medical Hx, Surgical Hx, Family Hx, Social Hx, medications and allergies.   ROS  Pertinent items noted in HPI and remainder of comprehensive ROS otherwise negative.   PHYSICAL EXAM  Patient Vitals for the past 24 hrs:  BP Temp Temp src Pulse Resp SpO2  10/11/23 0107 120/68 98.4 F (36.9 C) Oral 83 17 100 %  10/11/23 0030 127/81 -- -- 83 -- 98 %  10/11/23 0015 119/73 -- -- 75 -- 97 %  10/11/23 0000 122/81 -- -- 89 -- 100 %  10/10/23 2345 122/77 -- -- 85 -- 98 %  10/10/23 2330 124/85 -- -- 88 -- 99 %  10/10/23 2315 130/77 -- -- 87 -- 100 %  10/10/23 2300 130/84 -- -- 87 -- 99 %  10/10/23 2252 135/81 98.5 F (36.9 C) Oral 86 18 97 %  10/10/23 2245 -- -- -- 85 -- 99 %   Constitutional: Well-developed, well-nourished female  in no acute distress.  Cardiovascular: normal rate & rhythm, warm and well-perfused Respiratory: normal effort, no problems with respiration noted GI: Abd soft, non-tender, non-distended MS: Extremities nontender, no edema, normal ROM Neurologic: Alert and oriented x 4.  GU: no CVA tenderness Pelvic: NEFG, physiologic discharge, no blood  Dilation: Closed Effacement (%): Thick Cervical Position: Posterior Station: Ballotable Presentation: Undeterminable Exam by:: Tank Difiore,CNM  Fetal Tracing: reactive Baseline: 140 Variability: moderate for gestational age Accelerations: 10x10 Decelerations: occasional variable Toco: very mild irregular UC noted, not feeling cramping by end of MAU visit   Labs: Results for orders placed or performed during the hospital encounter of 10/10/23 (from the past 24 hour(s))  Wet prep, genital     Status: None   Collection Time: 10/11/23  1:50 AM  Result Value Ref Range   Yeast Wet Prep HPF POC NONE SEEN NONE SEEN   Trich, Wet Prep NONE SEEN NONE SEEN   Clue Cells Wet Prep HPF POC NONE SEEN NONE SEEN   WBC, Wet Prep HPF POC <10 <10   Sperm NONE SEEN   Fetal fibronectin     Status: None   Collection Time: 10/11/23  1:50 AM  Result Value Ref Range   Fetal Fibronectin NEGATIVE NEGATIVE  Urinalysis, Routine w reflex microscopic -Urine, Clean Catch     Status: Abnormal   Collection Time: 10/11/23  2:00 AM  Result Value Ref Range   Color, Urine YELLOW YELLOW   APPearance HAZY (A) CLEAR   Specific Gravity, Urine 1.017 1.005 - 1.030   pH 6.0 5.0 - 8.0   Glucose, UA NEGATIVE NEGATIVE mg/dL   Hgb urine dipstick NEGATIVE NEGATIVE   Bilirubin Urine NEGATIVE NEGATIVE   Ketones, ur NEGATIVE NEGATIVE mg/dL   Protein, ur NEGATIVE NEGATIVE mg/dL   Nitrite NEGATIVE NEGATIVE   Leukocytes,Ua NEGATIVE NEGATIVE   Imaging:  No results found.  MDM & MAU COURSE  MDM: Low  MAU Course: Orders Placed This Encounter  Procedures   Wet prep, genital    Fetal fibronectin   Urinalysis, Routine w reflex microscopic -Urine, Clean Catch   Discharge patient   No orders of the defined types were placed in this encounter.  Collected swabs and performed SVE with RN as chaperone. Cervix c/t/p.   Labs resulted normal, cramping has eased up. Encouraged her to go home, rest and hydrate tomorrow.  Pt agreed to plan of care.  ASSESSMENT   1. Preterm uterine contractions   2. [redacted] weeks gestation of pregnancy   3. NST (non-stress test) reactive    PLAN  Discharge home in stable condition with preterm labor precautions.     Follow-up Information     [facility] for [facility] Follow up.   Specialty: Obstetrics and Gynecology Why: as scheduled for ongoing prenatal care Contact information: [address] [place] [address] [phone]                Allergies as of 10/11/2023       Reactions   Egg-derived Products Other (See Comments)   Severe ab pain Other Reaction(s): Other (See Comments)        Medication List     TAKE these medications    aspirin 81 MG chewable tablet Chew 1 tablet (81 mg total) by mouth daily. Start at 15 weeks   levothyroxine 50 MCG tablet Commonly known as: SYNTHROID Take 50 mcg by mouth daily.   NIFEdipine 30 MG 24 hr tablet Commonly known as: PROCARDIA-XL/NIFEDICAL-XL Take 1 tablet (30 mg total) by mouth daily.   pregabalin 25 MG capsule Commonly known as: LYRICA Take 25 mg by mouth 2 (two) times daily.   PRENATAL VITAMIN PO Take 1 tablet by mouth daily.   promethazine 25 MG tablet Commonly known as: PHENERGAN Take 1 tablet (25 mg total) by mouth every 6 (six) hours as needed for nausea or vomiting.       Edd Arbour, CNM, MSN, IBCLC Certified Nurse Midwife, [facility]

## 2023-10-11 NOTE — Progress Notes (Signed)
OBRR at bedside at 2350. Pt reports no VB, LOF, and is feeling baby move frequently. Reporting ctx every 6-10 minutes and rating them 8/10 on the pain scale. 20 minute FHR/UC monitoring assessed and CTX palpated mild. 2 variable decelerations noted during monitoring, so decision was made to carelink pt to [facility] MAU. Provider notified. OBRR to ride carelink with pt.

## 2023-10-11 NOTE — MAU Note (Signed)
Shannon [name] is a 17 y.o. at [redacted]w[redacted]d here in MAU reporting: transferred from [place]; cramping that began 10/17 around 8pm, and it kept progressing throughout the day. Around 7pm yesterday the cramping got progressively worse and she went to [place] to be checked out.   Onset of complaint: 10/17 8pm Pain score: 7/10 Vitals:   10/11/23 0030 10/11/23 0107  BP: 127/81 120/68  Pulse: 83 83  Resp:  17  Temp:  98.4 F (36.9 C)  SpO2: 98% 100%     FHT:135 Lab orders placed from triage:  none

## 2023-10-13 ENCOUNTER — Ambulatory Visit: Payer: Medicaid Other | Attending: Obstetrics

## 2023-10-13 ENCOUNTER — Other Ambulatory Visit: Payer: Self-pay

## 2023-10-13 ENCOUNTER — Ambulatory Visit: Payer: Medicaid Other | Admitting: *Deleted

## 2023-10-13 VITALS — BP 119/67 | HR 83

## 2023-10-13 DIAGNOSIS — O365990 Maternal care for other known or suspected poor fetal growth, unspecified trimester, not applicable or unspecified: Secondary | ICD-10-CM | POA: Diagnosis present

## 2023-10-13 DIAGNOSIS — O99282 Endocrine, nutritional and metabolic diseases complicating pregnancy, second trimester: Secondary | ICD-10-CM | POA: Diagnosis not present

## 2023-10-13 DIAGNOSIS — O132 Gestational [pregnancy-induced] hypertension without significant proteinuria, second trimester: Secondary | ICD-10-CM | POA: Diagnosis not present

## 2023-10-13 DIAGNOSIS — O09292 Supervision of pregnancy with other poor reproductive or obstetric history, second trimester: Secondary | ICD-10-CM

## 2023-10-13 DIAGNOSIS — E669 Obesity, unspecified: Secondary | ICD-10-CM

## 2023-10-13 DIAGNOSIS — J45909 Unspecified asthma, uncomplicated: Secondary | ICD-10-CM

## 2023-10-13 DIAGNOSIS — O99512 Diseases of the respiratory system complicating pregnancy, second trimester: Secondary | ICD-10-CM

## 2023-10-13 DIAGNOSIS — O365920 Maternal care for other known or suspected poor fetal growth, second trimester, not applicable or unspecified: Secondary | ICD-10-CM

## 2023-10-13 DIAGNOSIS — H9190 Unspecified hearing loss, unspecified ear: Secondary | ICD-10-CM

## 2023-10-13 DIAGNOSIS — Z3A27 27 weeks gestation of pregnancy: Secondary | ICD-10-CM

## 2023-10-13 DIAGNOSIS — E039 Hypothyroidism, unspecified: Secondary | ICD-10-CM | POA: Diagnosis not present

## 2023-10-13 DIAGNOSIS — O99212 Obesity complicating pregnancy, second trimester: Secondary | ICD-10-CM

## 2023-10-13 DIAGNOSIS — O99891 Other specified diseases and conditions complicating pregnancy: Secondary | ICD-10-CM

## 2023-10-13 LAB — GC/CHLAMYDIA PROBE AMP (~~LOC~~) NOT AT ARMC
Chlamydia: NEGATIVE
Comment: NEGATIVE
Comment: NORMAL
Neisseria Gonorrhea: NEGATIVE

## 2023-10-14 ENCOUNTER — Other Ambulatory Visit: Payer: Medicaid Other

## 2023-10-14 ENCOUNTER — Encounter: Payer: Medicaid Other | Admitting: Obstetrics and Gynecology

## 2023-10-20 ENCOUNTER — Ambulatory Visit: Payer: Medicaid Other | Admitting: *Deleted

## 2023-10-20 ENCOUNTER — Ambulatory Visit: Payer: Medicaid Other | Attending: Obstetrics

## 2023-10-20 ENCOUNTER — Other Ambulatory Visit: Payer: Self-pay

## 2023-10-20 VITALS — BP 131/74 | HR 85

## 2023-10-20 DIAGNOSIS — O365930 Maternal care for other known or suspected poor fetal growth, third trimester, not applicable or unspecified: Secondary | ICD-10-CM

## 2023-10-20 DIAGNOSIS — E669 Obesity, unspecified: Secondary | ICD-10-CM | POA: Diagnosis not present

## 2023-10-20 DIAGNOSIS — O99513 Diseases of the respiratory system complicating pregnancy, third trimester: Secondary | ICD-10-CM

## 2023-10-20 DIAGNOSIS — O365990 Maternal care for other known or suspected poor fetal growth, unspecified trimester, not applicable or unspecified: Secondary | ICD-10-CM | POA: Diagnosis present

## 2023-10-20 DIAGNOSIS — O365911 Maternal care for other known or suspected poor fetal growth, first trimester, fetus 1: Secondary | ICD-10-CM | POA: Insufficient documentation

## 2023-10-20 DIAGNOSIS — O132 Gestational [pregnancy-induced] hypertension without significant proteinuria, second trimester: Secondary | ICD-10-CM

## 2023-10-20 DIAGNOSIS — Z3A28 28 weeks gestation of pregnancy: Secondary | ICD-10-CM | POA: Insufficient documentation

## 2023-10-20 DIAGNOSIS — J45909 Unspecified asthma, uncomplicated: Secondary | ICD-10-CM

## 2023-10-20 DIAGNOSIS — O99213 Obesity complicating pregnancy, third trimester: Secondary | ICD-10-CM | POA: Diagnosis not present

## 2023-10-20 DIAGNOSIS — O99283 Endocrine, nutritional and metabolic diseases complicating pregnancy, third trimester: Secondary | ICD-10-CM

## 2023-10-20 DIAGNOSIS — E039 Hypothyroidism, unspecified: Secondary | ICD-10-CM

## 2023-10-20 DIAGNOSIS — O09293 Supervision of pregnancy with other poor reproductive or obstetric history, third trimester: Secondary | ICD-10-CM

## 2023-10-20 NOTE — Procedures (Signed)
Shannon [name] 07-Sep-2006 [redacted]w[redacted]d  Fetus A Non-Stress Test Interpretation for 10/20/23  Indication: IUGR  Fetal Heart Rate A Mode: External Baseline Rate (A): 145 bpm Variability: Moderate Accelerations: 15 x 15 Decelerations: None Multiple birth?: No  Uterine Activity Mode: Palpation, Toco Contraction Frequency (min): None Resting Tone Palpated: Relaxed Resting Time: Adequate  Interpretation (Fetal Testing) Nonstress Test Interpretation: Reactive Comments: Dr. Darra Lis reviewed tracing.

## 2023-10-23 ENCOUNTER — Ambulatory Visit (INDEPENDENT_AMBULATORY_CARE_PROVIDER_SITE_OTHER): Payer: Medicaid Other | Admitting: Obstetrics and Gynecology

## 2023-10-23 ENCOUNTER — Other Ambulatory Visit: Payer: Medicaid Other

## 2023-10-23 VITALS — BP 111/76 | HR 78 | Wt 159.7 lb

## 2023-10-23 DIAGNOSIS — Z1339 Encounter for screening examination for other mental health and behavioral disorders: Secondary | ICD-10-CM

## 2023-10-23 DIAGNOSIS — O09299 Supervision of pregnancy with other poor reproductive or obstetric history, unspecified trimester: Secondary | ICD-10-CM

## 2023-10-23 DIAGNOSIS — Z3483 Encounter for supervision of other normal pregnancy, third trimester: Secondary | ICD-10-CM

## 2023-10-23 DIAGNOSIS — O365990 Maternal care for other known or suspected poor fetal growth, unspecified trimester, not applicable or unspecified: Secondary | ICD-10-CM

## 2023-10-23 DIAGNOSIS — E039 Hypothyroidism, unspecified: Secondary | ICD-10-CM

## 2023-10-23 DIAGNOSIS — O133 Gestational [pregnancy-induced] hypertension without significant proteinuria, third trimester: Secondary | ICD-10-CM

## 2023-10-23 DIAGNOSIS — Z3480 Encounter for supervision of other normal pregnancy, unspecified trimester: Secondary | ICD-10-CM

## 2023-10-23 DIAGNOSIS — Z3A28 28 weeks gestation of pregnancy: Secondary | ICD-10-CM | POA: Diagnosis not present

## 2023-10-23 NOTE — Progress Notes (Signed)
   PRENATAL VISIT NOTE  Subjective:  Shannon [name] is a 17 y.o. G2P1001 at [redacted]w[redacted]d being seen today for ongoing prenatal care.  She is currently monitored for the following issues for this high-risk pregnancy and has Hearing impaired; Mild persistent asthma; Supervision of other normal pregnancy, antepartum; Hx of preeclampsia, prior pregnancy, currently pregnant; History of delivery by vacuum extraction, currently pregnant; Hypothyroidism; Scoliosis; Gestational hypertension; and Fetal growth restriction antepartum on their problem list.  Patient doing well with no acute concerns today. She reports no complaints.  Contractions: Not present. Vag. Bleeding: None.  Movement: Present. Denies leaking of fluid.   The following portions of the patient's history were reviewed and updated as appropriate: allergies, current medications, past family history, past medical history, past social history, past surgical history and problem list. Problem list updated.  Objective:   Vitals:   10/23/23 0847  BP: 111/76  Pulse: 78  Weight: 159 lb 11.2 oz (72.4 kg)    Fetal Status: Fetal Heart Rate (bpm): 148 Fundal Height: 28 cm Movement: Present     General:  Alert, oriented and cooperative. Patient is in no acute distress.  Skin: Skin is warm and dry. No rash noted.   Cardiovascular: Normal heart rate noted  Respiratory: Normal respiratory effort, no problems with respiration noted  Abdomen: Soft, gravid, appropriate for gestational age.  Pain/Pressure: Absent     Pelvic: Cervical exam deferred        Extremities: Normal range of motion.  Edema: Trace  Mental Status:  Normal mood and affect. Normal behavior. Normal judgment and thought content.   Assessment and Plan:  Pregnancy: G2P1001 at [redacted]w[redacted]d  1. Supervision of other normal pregnancy, antepartum Continue routine prenatal care  - Glucose Tolerance, 2 Hours w/1 Hour - RPR - CBC - HIV antibody (with reflex)  2. [redacted] weeks gestation of  pregnancy   3. Gestational hypertension, third trimester BP well controlled on procardia  4. Hypothyroidism, unspecified type Labs were down so will need to draw TSH and T4 next visit  5. Fetal growth restriction antepartum EFW at 6%, pt has weekly dopplers/testing and gorowth scan q 3 weeks  6. History of delivery by vacuum extraction, currently pregnant   7. Hx of preeclampsia, prior pregnancy, currently pregnant No s/sx of preeclampsia, pt compliant with baby ASA  Preterm labor symptoms and general obstetric precautions including but not limited to vaginal bleeding, contractions, leaking of fluid and fetal movement were reviewed in detail with the patient.  Please refer to After Visit Summary for other counseling recommendations.   Return in about 2 weeks (around 11/06/2023) for [facility], in person.   Mariel Aloe, MD Faculty Attending Center for [facility]

## 2023-10-24 LAB — CBC
Hematocrit: 38.3 % (ref 34.0–46.6)
Hemoglobin: 11.9 g/dL (ref 11.1–15.9)
MCH: 28.1 pg (ref 26.6–33.0)
MCHC: 31.1 g/dL — ABNORMAL LOW (ref 31.5–35.7)
MCV: 91 fL (ref 79–97)
Platelets: 290 10*3/uL (ref 150–450)
RBC: 4.23 x10E6/uL (ref 3.77–5.28)
RDW: 14.3 % (ref 11.7–15.4)
WBC: 7.3 10*3/uL (ref 3.4–10.8)

## 2023-10-24 LAB — HIV ANTIBODY (ROUTINE TESTING W REFLEX): HIV Screen 4th Generation wRfx: NONREACTIVE

## 2023-10-24 LAB — GLUCOSE TOLERANCE, 2 HOURS W/ 1HR
Glucose, 1 hour: 104 mg/dL (ref 70–179)
Glucose, 2 hour: 49 mg/dL — ABNORMAL LOW (ref 70–152)
Glucose, Fasting: 66 mg/dL — ABNORMAL LOW (ref 70–91)

## 2023-10-24 LAB — RPR: RPR Ser Ql: NONREACTIVE

## 2023-10-25 ENCOUNTER — Other Ambulatory Visit: Payer: Self-pay | Admitting: Obstetrics & Gynecology

## 2023-10-25 DIAGNOSIS — O132 Gestational [pregnancy-induced] hypertension without significant proteinuria, second trimester: Secondary | ICD-10-CM

## 2023-10-27 ENCOUNTER — Ambulatory Visit: Payer: Medicaid Other | Attending: Obstetrics

## 2023-10-27 ENCOUNTER — Ambulatory Visit: Payer: Medicaid Other | Admitting: *Deleted

## 2023-10-27 ENCOUNTER — Other Ambulatory Visit: Payer: Self-pay

## 2023-10-27 ENCOUNTER — Encounter: Payer: Self-pay | Admitting: *Deleted

## 2023-10-27 VITALS — BP 105/56 | HR 79

## 2023-10-27 DIAGNOSIS — O133 Gestational [pregnancy-induced] hypertension without significant proteinuria, third trimester: Secondary | ICD-10-CM

## 2023-10-27 DIAGNOSIS — Z3480 Encounter for supervision of other normal pregnancy, unspecified trimester: Secondary | ICD-10-CM | POA: Insufficient documentation

## 2023-10-27 DIAGNOSIS — O365930 Maternal care for other known or suspected poor fetal growth, third trimester, not applicable or unspecified: Secondary | ICD-10-CM | POA: Diagnosis not present

## 2023-10-27 DIAGNOSIS — O365990 Maternal care for other known or suspected poor fetal growth, unspecified trimester, not applicable or unspecified: Secondary | ICD-10-CM

## 2023-10-27 DIAGNOSIS — O99283 Endocrine, nutritional and metabolic diseases complicating pregnancy, third trimester: Secondary | ICD-10-CM

## 2023-10-27 DIAGNOSIS — O1493 Unspecified pre-eclampsia, third trimester: Secondary | ICD-10-CM | POA: Diagnosis not present

## 2023-10-27 DIAGNOSIS — E669 Obesity, unspecified: Secondary | ICD-10-CM

## 2023-10-27 DIAGNOSIS — O99513 Diseases of the respiratory system complicating pregnancy, third trimester: Secondary | ICD-10-CM | POA: Diagnosis not present

## 2023-10-27 DIAGNOSIS — Z3A29 29 weeks gestation of pregnancy: Secondary | ICD-10-CM

## 2023-10-27 DIAGNOSIS — O09293 Supervision of pregnancy with other poor reproductive or obstetric history, third trimester: Secondary | ICD-10-CM | POA: Diagnosis not present

## 2023-10-27 DIAGNOSIS — O99213 Obesity complicating pregnancy, third trimester: Secondary | ICD-10-CM | POA: Diagnosis not present

## 2023-10-27 DIAGNOSIS — E039 Hypothyroidism, unspecified: Secondary | ICD-10-CM | POA: Diagnosis not present

## 2023-10-27 DIAGNOSIS — J45909 Unspecified asthma, uncomplicated: Secondary | ICD-10-CM | POA: Diagnosis not present

## 2023-10-27 NOTE — Procedures (Signed)
Shannon [name] [birth year] [redacted]w[redacted]d  Fetus A Non-Stress Test Interpretation for 10/27/23  Indication: IUGR and gHTN , Hypothyroid  Fetal Heart Rate A Mode: External Baseline Rate (A): 135 bpm Variability: Minimal, Moderate Accelerations: 15 x 15 Decelerations: Variable (Maternal position change after variables and FHR became reactive w/no further decels) Multiple birth?: No  Uterine Activity Mode: Toco Contraction Frequency (min): None Resting Tone Palpated: Relaxed  Interpretation (Fetal Testing) Nonstress Test Interpretation: Reactive Comments: Tracing reviewed by Dr. Darra Lis

## 2023-11-03 ENCOUNTER — Ambulatory Visit: Payer: Medicaid Other

## 2023-11-03 ENCOUNTER — Other Ambulatory Visit: Payer: Self-pay

## 2023-11-03 ENCOUNTER — Ambulatory Visit: Payer: Medicaid Other | Attending: Maternal & Fetal Medicine | Admitting: *Deleted

## 2023-11-03 ENCOUNTER — Other Ambulatory Visit: Payer: Self-pay | Admitting: *Deleted

## 2023-11-03 ENCOUNTER — Other Ambulatory Visit: Payer: Medicaid Other

## 2023-11-03 VITALS — BP 148/81

## 2023-11-03 DIAGNOSIS — E039 Hypothyroidism, unspecified: Secondary | ICD-10-CM

## 2023-11-03 DIAGNOSIS — O365930 Maternal care for other known or suspected poor fetal growth, third trimester, not applicable or unspecified: Secondary | ICD-10-CM

## 2023-11-03 DIAGNOSIS — O365920 Maternal care for other known or suspected poor fetal growth, second trimester, not applicable or unspecified: Secondary | ICD-10-CM

## 2023-11-03 DIAGNOSIS — E669 Obesity, unspecified: Secondary | ICD-10-CM

## 2023-11-03 DIAGNOSIS — O99283 Endocrine, nutritional and metabolic diseases complicating pregnancy, third trimester: Secondary | ICD-10-CM

## 2023-11-03 DIAGNOSIS — Z3480 Encounter for supervision of other normal pregnancy, unspecified trimester: Secondary | ICD-10-CM

## 2023-11-03 DIAGNOSIS — O365990 Maternal care for other known or suspected poor fetal growth, unspecified trimester, not applicable or unspecified: Secondary | ICD-10-CM | POA: Diagnosis not present

## 2023-11-03 DIAGNOSIS — O99213 Obesity complicating pregnancy, third trimester: Secondary | ICD-10-CM | POA: Diagnosis not present

## 2023-11-03 DIAGNOSIS — O133 Gestational [pregnancy-induced] hypertension without significant proteinuria, third trimester: Secondary | ICD-10-CM

## 2023-11-03 DIAGNOSIS — O09293 Supervision of pregnancy with other poor reproductive or obstetric history, third trimester: Secondary | ICD-10-CM

## 2023-11-03 DIAGNOSIS — Z3A30 30 weeks gestation of pregnancy: Secondary | ICD-10-CM | POA: Diagnosis not present

## 2023-11-03 DIAGNOSIS — J45909 Unspecified asthma, uncomplicated: Secondary | ICD-10-CM

## 2023-11-03 DIAGNOSIS — O99513 Diseases of the respiratory system complicating pregnancy, third trimester: Secondary | ICD-10-CM

## 2023-11-03 DIAGNOSIS — O132 Gestational [pregnancy-induced] hypertension without significant proteinuria, second trimester: Secondary | ICD-10-CM | POA: Insufficient documentation

## 2023-11-03 NOTE — Procedures (Signed)
Shannon [name] [birth year] [redacted]w[redacted]d  Fetus A Non-Stress Test Interpretation for 11/03/23  Indication: IUGR, GHTN  Fetal Heart Rate A Mode: External Baseline Rate (A): 130 bpm Variability: Moderate Accelerations: 15 x 15 Decelerations: None Multiple birth?: No  Uterine Activity Mode: Palpation, Toco Contraction Frequency (min): none Resting Tone Palpated: Relaxed  Interpretation (Fetal Testing) Nonstress Test Interpretation: Reactive Overall Impression: Reassuring for gestational age Comments: Dr. Judeth Cornfield reviewed tracing

## 2023-11-04 ENCOUNTER — Other Ambulatory Visit: Payer: Self-pay | Admitting: *Deleted

## 2023-11-04 DIAGNOSIS — O365990 Maternal care for other known or suspected poor fetal growth, unspecified trimester, not applicable or unspecified: Secondary | ICD-10-CM

## 2023-11-05 ENCOUNTER — Other Ambulatory Visit: Payer: Self-pay | Admitting: *Deleted

## 2023-11-05 ENCOUNTER — Encounter: Payer: Self-pay | Admitting: Obstetrics and Gynecology

## 2023-11-05 DIAGNOSIS — O133 Gestational [pregnancy-induced] hypertension without significant proteinuria, third trimester: Secondary | ICD-10-CM

## 2023-11-05 DIAGNOSIS — O365990 Maternal care for other known or suspected poor fetal growth, unspecified trimester, not applicable or unspecified: Secondary | ICD-10-CM

## 2023-11-06 ENCOUNTER — Encounter: Payer: Self-pay | Admitting: *Deleted

## 2023-11-06 ENCOUNTER — Ambulatory Visit: Payer: Medicaid Other | Admitting: Obstetrics & Gynecology

## 2023-11-06 ENCOUNTER — Encounter: Payer: Medicaid Other | Admitting: Obstetrics and Gynecology

## 2023-11-06 VITALS — BP 136/88 | HR 103 | Wt 169.0 lb

## 2023-11-06 DIAGNOSIS — E039 Hypothyroidism, unspecified: Secondary | ICD-10-CM

## 2023-11-06 DIAGNOSIS — Z3480 Encounter for supervision of other normal pregnancy, unspecified trimester: Secondary | ICD-10-CM

## 2023-11-06 DIAGNOSIS — O09299 Supervision of pregnancy with other poor reproductive or obstetric history, unspecified trimester: Secondary | ICD-10-CM

## 2023-11-06 DIAGNOSIS — M419 Scoliosis, unspecified: Secondary | ICD-10-CM

## 2023-11-06 DIAGNOSIS — O133 Gestational [pregnancy-induced] hypertension without significant proteinuria, third trimester: Secondary | ICD-10-CM

## 2023-11-06 DIAGNOSIS — O99210 Obesity complicating pregnancy, unspecified trimester: Secondary | ICD-10-CM

## 2023-11-06 DIAGNOSIS — O365990 Maternal care for other known or suspected poor fetal growth, unspecified trimester, not applicable or unspecified: Secondary | ICD-10-CM

## 2023-11-06 HISTORY — DX: Obesity complicating pregnancy, unspecified trimester: O99.210

## 2023-11-06 NOTE — Progress Notes (Signed)
Pt states she needs medical clearance letter for another medical provider.   Pt last provider note - pt needs TSH and T4 drawn today.

## 2023-11-06 NOTE — Progress Notes (Signed)
   PRENATAL VISIT NOTE  Subjective:  Shannon [name] is a 17 y.o. G2P1001 at [redacted]w[redacted]d being seen today for ongoing prenatal care.  She is currently monitored for the following issues for this high-risk pregnancy and has Hearing impaired; Mild persistent asthma; Supervision of other normal pregnancy, antepartum; Hx of preeclampsia, prior pregnancy, currently pregnant; History of delivery by vacuum extraction, currently pregnant; Hypothyroidism; Scoliosis; Gestational hypertension; and Fetal growth restriction antepartum on their problem list.  Patient reports backache.  Contractions: Irritability. Vag. Bleeding: None.  Movement: Present. Denies leaking of fluid.   The following portions of the patient's history were reviewed and updated as appropriate: allergies, current medications, past family history, past medical history, past social history, past surgical history and problem list.   Objective:   Vitals:   11/06/23 0952  BP: 136/88  Pulse: (!) 103  Weight: 169 lb (76.7 kg)    Fetal Status: Fetal Heart Rate (bpm): 135   Movement: Present     General:  Alert, oriented and cooperative. Patient is in no acute distress.  Skin: Skin is warm and dry. No rash noted.   Cardiovascular: Normal heart rate noted  Respiratory: Normal respiratory effort, no problems with respiration noted  Abdomen: Soft, gravid, appropriate for gestational age.  Pain/Pressure: Present     Pelvic: Cervical exam deferred        Extremities: Normal range of motion.     Mental Status: Normal mood and affect. Normal behavior. Normal judgment and thought content.   Assessment and Plan:  Pregnancy: G2P1001 at [redacted]w[redacted]d 1. Gestational hypertension, third trimester Controlled on Procardai  2. Hypothyroidism, unspecified type No outpatient medications have been marked as taking for the 11/06/23 encounter (Routine Prenatal) with Adam Phenix, MD.     3. Supervision of other normal pregnancy, antepartum F/u in MFM  weekly  4. Fetal growth restriction antepartum Growth [place]  5. Scoliosis, unspecified scoliosis type, unspecified spinal region Pain management through [facility] medical need record request  6. Hx of preeclampsia, prior pregnancy, currently pregnant   Preterm labor symptoms and general obstetric precautions including but not limited to vaginal bleeding, contractions, leaking of fluid and fetal movement were reviewed in detail with the patient. Please refer to After Visit Summary for other counseling recommendations.   Return in about 2 weeks (around 11/20/2023).  Future Appointments  Date Time Provider Department Center  11/10/2023  9:30 AM [facility] US2 [facility]  11/10/2023 10:45 AM [facility] NST [facility]  11/18/2023  9:35 AM Warden Fillers, MD [facility] None  11/18/2023  2:15 PM [facility] NURSE [facility]  11/18/2023  2:30 PM [facility] US7 [facility]  11/25/2023  2:15 PM [facility] NURSE [facility]  11/25/2023  2:30 PM [facility] US5 [facility]  12/02/2023 10:15 AM [facility] NURSE [facility]  12/02/2023 10:30 AM [facility] US2 [facility]  12/02/2023  1:15 PM [facility] NST [facility]    Scheryl Darter, MD

## 2023-11-10 ENCOUNTER — Ambulatory Visit: Payer: Medicaid Other | Attending: Maternal & Fetal Medicine

## 2023-11-10 ENCOUNTER — Ambulatory Visit: Payer: Medicaid Other | Admitting: *Deleted

## 2023-11-10 ENCOUNTER — Other Ambulatory Visit: Payer: Self-pay

## 2023-11-10 VITALS — BP 110/60 | HR 81

## 2023-11-10 DIAGNOSIS — O99283 Endocrine, nutritional and metabolic diseases complicating pregnancy, third trimester: Secondary | ICD-10-CM

## 2023-11-10 DIAGNOSIS — J45909 Unspecified asthma, uncomplicated: Secondary | ICD-10-CM

## 2023-11-10 DIAGNOSIS — Z3A31 31 weeks gestation of pregnancy: Secondary | ICD-10-CM

## 2023-11-10 DIAGNOSIS — O133 Gestational [pregnancy-induced] hypertension without significant proteinuria, third trimester: Secondary | ICD-10-CM | POA: Diagnosis not present

## 2023-11-10 DIAGNOSIS — O99213 Obesity complicating pregnancy, third trimester: Secondary | ICD-10-CM | POA: Diagnosis not present

## 2023-11-10 DIAGNOSIS — O132 Gestational [pregnancy-induced] hypertension without significant proteinuria, second trimester: Secondary | ICD-10-CM | POA: Insufficient documentation

## 2023-11-10 DIAGNOSIS — O365920 Maternal care for other known or suspected poor fetal growth, second trimester, not applicable or unspecified: Secondary | ICD-10-CM | POA: Insufficient documentation

## 2023-11-10 DIAGNOSIS — E669 Obesity, unspecified: Secondary | ICD-10-CM

## 2023-11-10 DIAGNOSIS — O365930 Maternal care for other known or suspected poor fetal growth, third trimester, not applicable or unspecified: Secondary | ICD-10-CM | POA: Diagnosis present

## 2023-11-10 DIAGNOSIS — O99513 Diseases of the respiratory system complicating pregnancy, third trimester: Secondary | ICD-10-CM

## 2023-11-10 DIAGNOSIS — E039 Hypothyroidism, unspecified: Secondary | ICD-10-CM

## 2023-11-10 DIAGNOSIS — O09293 Supervision of pregnancy with other poor reproductive or obstetric history, third trimester: Secondary | ICD-10-CM

## 2023-11-10 NOTE — Procedures (Signed)
Shannon [name] 14-Dec-2006 [redacted]w[redacted]d  Fetus A Non-Stress Test Interpretation for 11/10/23  Indication: IUGR  Fetal Heart Rate A Mode: External Baseline Rate (A): 140 bpm Variability: Moderate Accelerations: 15 x 15 Decelerations: None Multiple birth?: No  Uterine Activity Mode: Palpation, Toco Contraction Frequency (min): None Resting Tone Palpated: Relaxed Resting Time: Adequate  Interpretation (Fetal Testing) Nonstress Test Interpretation: Reactive Comments: Dr. Darra Lis reviewed tracing.

## 2023-11-12 ENCOUNTER — Inpatient Hospital Stay (HOSPITAL_COMMUNITY): Payer: Medicaid Other

## 2023-11-12 ENCOUNTER — Telehealth: Payer: Self-pay

## 2023-11-12 ENCOUNTER — Inpatient Hospital Stay (HOSPITAL_COMMUNITY)
Admission: AD | Admit: 2023-11-12 | Discharge: 2023-11-12 | Disposition: A | Discharge status: Home / Self Care | Payer: Medicaid Other | Attending: Obstetrics and Gynecology | Admitting: Obstetrics and Gynecology

## 2023-11-12 ENCOUNTER — Encounter (HOSPITAL_COMMUNITY): Payer: Self-pay | Admitting: Obstetrics and Gynecology

## 2023-11-12 DIAGNOSIS — O10913 Unspecified pre-existing hypertension complicating pregnancy, third trimester: Secondary | ICD-10-CM | POA: Insufficient documentation

## 2023-11-12 DIAGNOSIS — Z3A31 31 weeks gestation of pregnancy: Secondary | ICD-10-CM | POA: Insufficient documentation

## 2023-11-12 DIAGNOSIS — O1493 Unspecified pre-eclampsia, third trimester: Secondary | ICD-10-CM

## 2023-11-12 DIAGNOSIS — Z79899 Other long term (current) drug therapy: Secondary | ICD-10-CM | POA: Diagnosis not present

## 2023-11-12 DIAGNOSIS — O1403 Mild to moderate pre-eclampsia, third trimester: Secondary | ICD-10-CM | POA: Insufficient documentation

## 2023-11-12 LAB — PROTEIN / CREATININE RATIO, URINE
Creatinine, Urine: 70 mg/dL
Protein Creatinine Ratio: 0.86 mg/mg{creat} — ABNORMAL HIGH (ref 0.00–0.15)
Total Protein, Urine: 60 mg/dL

## 2023-11-12 LAB — COMPREHENSIVE METABOLIC PANEL
ALT: 7 U/L (ref 0–44)
AST: 17 U/L (ref 15–41)
Albumin: 2.6 g/dL — ABNORMAL LOW (ref 3.5–5.0)
Alkaline Phosphatase: 110 U/L (ref 38–126)
Anion gap: 9 (ref 5–15)
BUN: 6 mg/dL (ref 6–20)
CO2: 20 mmol/L — ABNORMAL LOW (ref 22–32)
Calcium: 8.9 mg/dL (ref 8.9–10.3)
Chloride: 108 mmol/L (ref 98–111)
Creatinine, Ser: 0.4 mg/dL — ABNORMAL LOW (ref 0.44–1.00)
GFR, Estimated: >60 mL/min (ref >60)
Glucose, Bld: 78 mg/dL (ref 70–99)
Potassium: 3.9 mmol/L (ref 3.5–5.1)
Sodium: 137 mmol/L (ref 135–145)
Total Bilirubin: 0.4 mg/dL (ref <1.2)
Total Protein: 6.3 g/dL — ABNORMAL LOW (ref 6.5–8.1)

## 2023-11-12 LAB — URINALYSIS, ROUTINE W REFLEX MICROSCOPIC
Bilirubin Urine: NEGATIVE
Glucose, UA: NEGATIVE mg/dL
Hgb urine dipstick: NEGATIVE
Ketones, ur: NEGATIVE mg/dL
Nitrite: NEGATIVE
Protein, ur: 30 mg/dL — AB
Specific Gravity, Urine: 1.016 (ref 1.005–1.030)
pH: 6 (ref 5.0–8.0)

## 2023-11-12 LAB — CBC WITH DIFFERENTIAL/PLATELET
Abs Immature Granulocytes: 0.06 10*3/uL (ref 0.00–0.07)
Basophils Absolute: 0 10*3/uL (ref 0.0–0.1)
Basophils Relative: 0 %
Eosinophils Absolute: 0 10*3/uL (ref 0.0–0.5)
Eosinophils Relative: 0 %
HCT: 31.5 % — ABNORMAL LOW (ref 36.0–46.0)
Hemoglobin: 10.5 g/dL — ABNORMAL LOW (ref 12.0–15.0)
Immature Granulocytes: 1 %
Lymphocytes Relative: 18 %
Lymphs Abs: 1.3 10*3/uL (ref 0.7–4.0)
MCH: 28.6 pg (ref 26.0–34.0)
MCHC: 33.3 g/dL (ref 30.0–36.0)
MCV: 85.8 fL (ref 80.0–100.0)
Monocytes Absolute: 0.6 10*3/uL (ref 0.1–1.0)
Monocytes Relative: 8 %
Neutro Abs: 5.4 10*3/uL (ref 1.7–7.7)
Neutrophils Relative %: 73 %
Platelets: 238 10*3/uL (ref 150–400)
RBC: 3.67 MIL/uL — ABNORMAL LOW (ref 3.87–5.11)
RDW: 14.7 % (ref 11.5–15.5)
WBC: 7.3 10*3/uL (ref 4.0–10.5)
nRBC: 0.3 % — ABNORMAL HIGH (ref 0.0–0.2)

## 2023-11-12 LAB — TSH: TSH: 2.167 u[IU]/mL (ref 0.350–4.500)

## 2023-11-12 MED ORDER — ALUM & MAG HYDROXIDE-SIMETH 200-200-20 MG/5ML PO SUSP
15.0000 mL | Freq: Once | ORAL | Status: AC
  Administered 2023-11-12: 15 mL via ORAL
  Filled 2023-11-12: qty 30

## 2023-11-12 MED ORDER — PROMETHAZINE HCL 25 MG PO TABS: 25.0000 mg | ORAL_TABLET | Freq: Four times a day (QID) | ORAL | 0 refills | Status: AC | PRN

## 2023-11-12 MED ORDER — ONDANSETRON 4 MG PO TBDP
8.0000 mg | ORAL_TABLET | Freq: Once | ORAL | Status: AC
  Administered 2023-11-12: 8 mg via ORAL
  Filled 2023-11-12: qty 2

## 2023-11-12 MED ORDER — ACETAMINOPHEN 500 MG PO TABS
1000.0000 mg | ORAL_TABLET | Freq: Once | ORAL | Status: AC
  Administered 2023-11-12: 1000 mg via ORAL
  Filled 2023-11-12: qty 2

## 2023-11-12 MED ORDER — ONDANSETRON HCL 4 MG PO TABS: 4.0000 mg | ORAL_TABLET | Freq: Three times a day (TID) | ORAL | 0 refills | Status: AC | PRN

## 2023-11-12 MED ORDER — LABETALOL HCL 100 MG PO TABS: 200.0000 mg | ORAL_TABLET | Freq: Two times a day (BID) | ORAL | 1 refills | Status: DC

## 2023-11-12 MED ORDER — LABETALOL HCL 100 MG PO TABS
200.0000 mg | ORAL_TABLET | Freq: Once | ORAL | Status: AC
  Administered 2023-11-12: 200 mg via ORAL
  Filled 2023-11-12: qty 2

## 2023-11-12 NOTE — MAU Provider Note (Addendum)
History     CSN: [id number]  Arrival date and time: 11/12/23 1722   None     Chief Complaint  Patient presents with   Headache   Hypertension   Chest Pain   Nausea   Shannon [name] is a 17 y.o. at [redacted]w[redacted]d here in MAU reporting: had high BP earlier this afternoon, having chest pain- started 1620 (feels like something heavy sitting on her chest with a stabbing pain). Thought it was indigestion this morning, thought it would go away on it's own, didn't take anything.  Has been nauseated and has a bad HA.  Did not take anything for it.    Onset of complaint: started this morning, got worse later this afternoon Pain score: chest 6  HA 7  Patient presents with HA and chest pain today. BP elevated to max of 171/88 at home.     OB History     Gravida  2   Para  1   Term  1   Preterm  0   AB  0   Living  1      SAB  0   IAB  0   Ectopic  0   Multiple      Live Births  1           Past Medical History:  Diagnosis Date   Allergic conjunctivitis 02/23/2018   Asthma    Hard of hearing    Hearing loss    Hypothyroidism    Impaired hearing    Otitis    Pregnancy complicated by obesity 11/06/2023   Scoliosis     Past Surgical History:  Procedure Laterality Date   ADENOIDECTOMY     INCISION AND DRAINAGE ABSCESS Left 08/18/2014   Procedure: INCISION AND DRAINAGE ABSCESS - Left Arm;  Surgeon: Judie Petit. [name], MD;  Location: [facility] OR;  Service: Pediatrics;  Laterality: Left;   TONSILLECTOMY      Family History  Problem Relation Age of Onset   Migraines Mother    Anxiety disorder Mother    Depression Mother    Migraines Brother    Autism Brother    ADD / ADHD Neg Hx    Bipolar disorder Neg Hx    Schizophrenia Neg Hx    Diabetes Maternal Grandmother    Hypertension Maternal Grandmother    Heart disease Maternal Grandmother    Learning disabilities Brother    Asthma Brother    Allergic rhinitis Brother    Learning disabilities Maternal Uncle     Mental illness Maternal Uncle    Mental retardation Maternal Uncle    Healthy Mother    Healthy Father    Eczema Neg Hx     Social History   Tobacco Use   Smoking status: Never    Passive exposure: Yes   Smokeless tobacco: Never   Tobacco comments:    mom and step dad smokes outside  Vaping Use   Vaping status: Never Used  Substance Use Topics   Alcohol use: Never   Drug use: Never    Allergies:  Allergies  Allergen Reactions   Egg-Derived Products Other (See Comments)    Severe ab pain  Other Reaction(s): Other (See Comments)    Medications Prior to Admission  Medication Sig Dispense Refill Last Dose   aspirin 81 MG chewable tablet Chew 1 tablet (81 mg total) by mouth daily. Start at 15 weeks 30 tablet 5 11/12/2023   levothyroxine (SYNTHROID) 50 MCG tablet Take 50 mcg  by mouth daily.   11/12/2023   NIFEdipine (PROCARDIA-XL/NIFEDICAL-XL) 30 MG 24 hr tablet TAKE 1 TABLET(30 MG) BY MOUTH DAILY 30 tablet 2 Past Month   pregabalin (LYRICA) 25 MG capsule Take 25 mg by mouth 2 (two) times daily.   11/12/2023   Prenatal Vit-Fe Fumarate-FA (PRENATAL VITAMIN PO) Take 1 tablet by mouth daily.   11/12/2023   promethazine (PHENERGAN) 25 MG tablet Take 1 tablet (25 mg total) by mouth every 6 (six) hours as needed for nausea or vomiting. 30 tablet 2 11/10/2023    Review of Systems  Constitutional:  Negative for chills, fatigue, fever and unexpected weight change.  Respiratory:  Negative for cough and shortness of breath.   Cardiovascular:  Positive for chest pain. Negative for palpitations.  Gastrointestinal:  Positive for nausea. Negative for abdominal pain, constipation, diarrhea and vomiting.  Genitourinary:  Negative for difficulty urinating, flank pain, frequency and urgency.  Neurological:  Positive for headaches.   Physical Exam   Blood pressure 139/85, pulse 100, temperature 98.1 F (36.7 C), temperature source Oral, resp. rate 16, height 4\' 9"  (1.448 m), weight 75.8 kg,  last menstrual period 04/06/2023, SpO2 98%.  Physical Exam Vitals reviewed.  Constitutional:      Appearance: Normal appearance.  HENT:     Head: Normocephalic.  Cardiovascular:     Rate and Rhythm: Normal rate.     Pulses: Normal pulses.  Pulmonary:     Effort: Pulmonary effort is normal.  Skin:    General: Skin is warm and dry.     Capillary Refill: Capillary refill takes less than 2 seconds.  Neurological:     Mental Status: She is alert and oriented to person, place, and time.  Psychiatric:        Mood and Affect: Mood normal.        Behavior: Behavior normal.        Thought Content: Thought content normal.        Judgment: Judgment normal.     Fetal Assessment 145 bpm, Mod Var, -Decels, +Accels Toco: occ UC, UI  MAU Course   Results for orders placed or performed during the hospital encounter of 11/12/23 (from the past 24 hour(s))  Urinalysis, Routine w reflex microscopic -Urine, Clean Catch     Status: Abnormal   Collection Time: 11/12/23  5:46 PM  Result Value Ref Range   Color, Urine YELLOW YELLOW   APPearance HAZY (A) CLEAR   Specific Gravity, Urine 1.016 1.005 - 1.030   pH 6.0 5.0 - 8.0   Glucose, UA NEGATIVE NEGATIVE mg/dL   Hgb urine dipstick NEGATIVE NEGATIVE   Bilirubin Urine NEGATIVE NEGATIVE   Ketones, ur NEGATIVE NEGATIVE mg/dL   Protein, ur 30 (A) NEGATIVE mg/dL   Nitrite NEGATIVE NEGATIVE   Leukocytes,Ua MODERATE (A) NEGATIVE   RBC / HPF 0-5 0 - 5 RBC/hpf   WBC, UA 6-10 0 - 5 WBC/hpf   Bacteria, UA MANY (A) NONE SEEN   Squamous Epithelial / HPF 6-10 0 - 5 /HPF   Mucus PRESENT   Protein / creatinine ratio, urine     Status: Abnormal   Collection Time: 11/12/23  5:46 PM  Result Value Ref Range   Creatinine, Urine 70 mg/dL   Total Protein, Urine 60 mg/dL   Protein Creatinine Ratio 0.86 (H) 0.00 - 0.15 mg/mg[Cre]  CBC with Differential/Platelet     Status: Abnormal   Collection Time: 11/12/23  6:15 PM  Result Value Ref Range   WBC 7.3 4.0 -  10.5 K/uL   RBC 3.67 (L) 3.87 - 5.11 MIL/uL   Hemoglobin 10.5 (L) 12.0 - 15.0 g/dL   HCT 16.1 (L) 09.6 - 04.5 %   MCV 85.8 80.0 - 100.0 fL   MCH 28.6 26.0 - 34.0 pg   MCHC 33.3 30.0 - 36.0 g/dL   RDW 40.9 81.1 - 91.4 %   Platelets 238 150 - 400 K/uL   nRBC 0.3 (H) 0.0 - 0.2 %   Neutrophils Relative % 73 %   Neutro Abs 5.4 1.7 - 7.7 K/uL   Lymphocytes Relative 18 %   Lymphs Abs 1.3 0.7 - 4.0 K/uL   Monocytes Relative 8 %   Monocytes Absolute 0.6 0.1 - 1.0 K/uL   Eosinophils Relative 0 %   Eosinophils Absolute 0.0 0.0 - 0.5 K/uL   Basophils Relative 0 %   Basophils Absolute 0.0 0.0 - 0.1 K/uL   Immature Granulocytes 1 %   Abs Immature Granulocytes 0.06 0.00 - 0.07 K/uL  Comprehensive metabolic panel     Status: Abnormal   Collection Time: 11/12/23  6:15 PM  Result Value Ref Range   Sodium 137 135 - 145 mmol/L   Potassium 3.9 3.5 - 5.1 mmol/L   Chloride 108 98 - 111 mmol/L   CO2 20 (L) 22 - 32 mmol/L   Glucose, Bld 78 70 - 99 mg/dL   BUN 6 6 - 20 mg/dL   Creatinine, Ser 7.82 (L) 0.44 - 1.00 mg/dL   Calcium 8.9 8.9 - 95.6 mg/dL   Total Protein 6.3 (L) 6.5 - 8.1 g/dL   Albumin 2.6 (L) 3.5 - 5.0 g/dL   AST 17 15 - 41 U/L   ALT 7 0 - 44 U/L   Alkaline Phosphatase 110 38 - 126 U/L   Total Bilirubin 0.4 <1.2 mg/dL   GFR, Estimated >21 >30 mL/min   Anion gap 9 5 - 15   No results found.  MDM PE Labs:CMP, CBC, PCR, EKG, CXR EFM  Assessment and Plan  17y  G2 P1001  SIUP at 31.3weeks Cat 1 FT   - Exam findings discussed. Patient known hypertensive. Now meets criteria for Pre-eclampsia without severe features (mild).  - Discussed POC with Dr. Charlotta Newton. Labetalol here. Labetalol 200mg  BID for home with BP check in office Monday. Strict precautions. - Benign findings overall.  Reassessment (8:11 PM) - Nausea, headache, and chest discomfort resolved with medications.  - Prescriptions sent: Labetalol, zofran, phenergan. - Return to MAU PRN. - Discharged home in stable  condition.    Richardson Landry MSN, CNM 11/12/2023, 8:11 PM

## 2023-11-12 NOTE — MAU Note (Signed)
Shannon [name] is a 17 y.o. at [redacted]w[redacted]d here in MAU reporting: had high BP earlier this afternoon, having chest pain- started 1620 (feels like something heavy sitting on her chest with a stabbing pain). Thought it was indigestion this morning, thought it would go away on it's own, didn't take anything.  Has been nauseated and has a bad HA.  Did not take anything for it.   Onset of complaint: started this morning, got worse later this afternoon Pain score: chest 6  HA 7 Vitals:   11/12/23 1739 11/12/23 1742  BP:  (!) 152/89  Pulse:  (!) 103  Resp:  16  Temp:  98.1 F (36.7 C)  SpO2: 99% 99%     FHT:172 Lab orders placed from triage:  urine collected.  EKG ordered

## 2023-11-12 NOTE — Telephone Encounter (Signed)
Called patient regarding elevated BP recorded in [place] of 171/88. Also states that she is having shortness of breath, chest pain, and persistent headaches.  BP repeat during time of call 197/102. Patient advised to go to MAU ASAP for evaluation. Patient verbalized understanding.

## 2023-11-17 ENCOUNTER — Ambulatory Visit: Payer: Medicaid Other

## 2023-11-17 VITALS — BP 106/71 | HR 91

## 2023-11-17 DIAGNOSIS — O1493 Unspecified pre-eclampsia, third trimester: Secondary | ICD-10-CM

## 2023-11-17 DIAGNOSIS — Z0130 Encounter for examination of blood pressure without abnormal findings: Secondary | ICD-10-CM

## 2023-11-17 NOTE — Progress Notes (Signed)
Subjective:  Shannon [name] is a 17 y.o. female here for BP check. BP today is 106/71   Hypertension ROS: taking medications as instructed, no medication side effects noted, no TIA's, no chest pain on exertion, no dyspnea on exertion, and no swelling of ankles.    Objective:  LMP 04/06/2023 (Exact Date) Comment: pt reports normal for her to have irregular periods  Appearance alert, well appearing, and in no distress. General exam BP noted to be well controlled today in office.    Assessment:   Blood Pressure improved.   Plan:  Current treatment plan is effective, no change in therapy.Marland Kitchen

## 2023-11-18 ENCOUNTER — Ambulatory Visit: Payer: Medicaid Other | Attending: Obstetrics and Gynecology

## 2023-11-18 ENCOUNTER — Ambulatory Visit (INDEPENDENT_AMBULATORY_CARE_PROVIDER_SITE_OTHER): Payer: Medicaid Other | Admitting: Obstetrics and Gynecology

## 2023-11-18 ENCOUNTER — Ambulatory Visit: Payer: Medicaid Other | Admitting: *Deleted

## 2023-11-18 ENCOUNTER — Other Ambulatory Visit: Payer: Self-pay

## 2023-11-18 VITALS — BP 95/67 | HR 91 | Wt 165.0 lb

## 2023-11-18 VITALS — BP 146/81 | HR 87

## 2023-11-18 DIAGNOSIS — O99513 Diseases of the respiratory system complicating pregnancy, third trimester: Secondary | ICD-10-CM | POA: Insufficient documentation

## 2023-11-18 DIAGNOSIS — E039 Hypothyroidism, unspecified: Secondary | ICD-10-CM

## 2023-11-18 DIAGNOSIS — Z3A32 32 weeks gestation of pregnancy: Secondary | ICD-10-CM | POA: Diagnosis not present

## 2023-11-18 DIAGNOSIS — O09299 Supervision of pregnancy with other poor reproductive or obstetric history, unspecified trimester: Secondary | ICD-10-CM

## 2023-11-18 DIAGNOSIS — O365990 Maternal care for other known or suspected poor fetal growth, unspecified trimester, not applicable or unspecified: Secondary | ICD-10-CM | POA: Insufficient documentation

## 2023-11-18 DIAGNOSIS — O99283 Endocrine, nutritional and metabolic diseases complicating pregnancy, third trimester: Secondary | ICD-10-CM | POA: Diagnosis not present

## 2023-11-18 DIAGNOSIS — O99213 Obesity complicating pregnancy, third trimester: Secondary | ICD-10-CM | POA: Diagnosis not present

## 2023-11-18 DIAGNOSIS — O365930 Maternal care for other known or suspected poor fetal growth, third trimester, not applicable or unspecified: Secondary | ICD-10-CM | POA: Diagnosis not present

## 2023-11-18 DIAGNOSIS — J45909 Unspecified asthma, uncomplicated: Secondary | ICD-10-CM | POA: Insufficient documentation

## 2023-11-18 DIAGNOSIS — Z3480 Encounter for supervision of other normal pregnancy, unspecified trimester: Secondary | ICD-10-CM | POA: Insufficient documentation

## 2023-11-18 DIAGNOSIS — O133 Gestational [pregnancy-induced] hypertension without significant proteinuria, third trimester: Secondary | ICD-10-CM

## 2023-11-18 DIAGNOSIS — E669 Obesity, unspecified: Secondary | ICD-10-CM

## 2023-11-18 DIAGNOSIS — O09293 Supervision of pregnancy with other poor reproductive or obstetric history, third trimester: Secondary | ICD-10-CM | POA: Insufficient documentation

## 2023-11-18 DIAGNOSIS — M419 Scoliosis, unspecified: Secondary | ICD-10-CM

## 2023-11-18 NOTE — Progress Notes (Signed)
   PRENATAL VISIT NOTE  Subjective:  Shannon [name] is a 17 y.o. G2P1001 at [redacted]w[redacted]d being seen today for ongoing prenatal care.  She is currently monitored for the following issues for this high-risk pregnancy and has Hearing impaired; Mild persistent asthma; Supervision of other normal pregnancy, antepartum; Hx of preeclampsia, prior pregnancy, currently pregnant; History of delivery by vacuum extraction, currently pregnant; Hypothyroidism; Scoliosis; Gestational hypertension; Fetal growth restriction antepartum; and Pregnancy complicated by obesity on their problem list.  Patient doing well with no acute concerns today. She reports  continued generalized back discomfort. .  Contractions: Not present. Vag. Bleeding: None.  Movement: Present. Denies leaking of fluid.   The following portions of the patient's history were reviewed and updated as appropriate: allergies, current medications, past family history, past medical history, past social history, past surgical history and problem list. Problem list updated.  Objective:   Vitals:   11/18/23 0945  BP: 95/67  Pulse: 91  Weight: 165 lb (74.8 kg)    Fetal Status: Fetal Heart Rate (bpm): 143   Movement: Present     General:  Alert, oriented and cooperative. Patient is in no acute distress.  Skin: Skin is warm and dry. No rash noted.   Cardiovascular: Normal heart rate noted  Respiratory: Normal respiratory effort, no problems with respiration noted  Abdomen: Soft, gravid, appropriate for gestational age.  Pain/Pressure: Absent     Pelvic: Cervical exam deferred        Extremities: Normal range of motion.  Edema: Trace  Mental Status:  Normal mood and affect. Normal behavior. Normal judgment and thought content.   Assessment and Plan:  Pregnancy: G2P1001 at [redacted]w[redacted]d  1. Supervision of other normal pregnancy, antepartum Continue routine prenatal care  2. [redacted] weeks gestation of pregnancy   3. Gestational hypertension, third  trimester Continue weekly testing, BP well controlled on just labetalol  4. Hypothyroidism, unspecified type Last TSH was WNL  5. Scoliosis, unspecified scoliosis type, unspecified spinal region Pt received note for pain management for [facility] care.  She states she has failed physical therapy.  Pt advised I would prefer narcotic therapy after delivery if possible due to risk of withdrawal after delivery; however, she is adamant she needs this care.  Letter given for pt to take to [facility].  6. Fetal growth restriction antepartum EFW 6%  7. History of delivery by vacuum extraction, currently pregnant   8. Hx of preeclampsia, prior pregnancy, currently pregnant No s/sx of preeclampsia  Preterm labor symptoms and general obstetric precautions including but not limited to vaginal bleeding, contractions, leaking of fluid and fetal movement were reviewed in detail with the patient.  Please refer to After Visit Summary for other counseling recommendations.   Return in about 2 weeks (around 12/02/2023) for [facility], in person.   Mariel Aloe, MD Faculty Attending Center for [facility]

## 2023-11-18 NOTE — Progress Notes (Signed)
Patient was assessed and managed by nursing staff during this encounter. I have reviewed the chart and agree with the documentation and plan. I have also made any necessary editorial changes.  Warden Fillers, MD 11/18/2023 9:44 AM

## 2023-11-25 ENCOUNTER — Other Ambulatory Visit: Payer: Self-pay | Admitting: *Deleted

## 2023-11-25 ENCOUNTER — Encounter: Payer: Self-pay | Admitting: *Deleted

## 2023-11-25 ENCOUNTER — Ambulatory Visit: Payer: Medicaid Other | Admitting: *Deleted

## 2023-11-25 ENCOUNTER — Other Ambulatory Visit: Payer: Self-pay

## 2023-11-25 ENCOUNTER — Ambulatory Visit: Payer: Medicaid Other | Attending: Obstetrics and Gynecology

## 2023-11-25 ENCOUNTER — Other Ambulatory Visit: Payer: Medicaid Other

## 2023-11-25 VITALS — BP 126/68 | HR 75

## 2023-11-25 DIAGNOSIS — O99283 Endocrine, nutritional and metabolic diseases complicating pregnancy, third trimester: Secondary | ICD-10-CM | POA: Diagnosis not present

## 2023-11-25 DIAGNOSIS — O133 Gestational [pregnancy-induced] hypertension without significant proteinuria, third trimester: Secondary | ICD-10-CM | POA: Diagnosis not present

## 2023-11-25 DIAGNOSIS — O365930 Maternal care for other known or suspected poor fetal growth, third trimester, not applicable or unspecified: Secondary | ICD-10-CM | POA: Insufficient documentation

## 2023-11-25 DIAGNOSIS — O99513 Diseases of the respiratory system complicating pregnancy, third trimester: Secondary | ICD-10-CM | POA: Diagnosis not present

## 2023-11-25 DIAGNOSIS — Z3480 Encounter for supervision of other normal pregnancy, unspecified trimester: Secondary | ICD-10-CM | POA: Insufficient documentation

## 2023-11-25 DIAGNOSIS — O365990 Maternal care for other known or suspected poor fetal growth, unspecified trimester, not applicable or unspecified: Secondary | ICD-10-CM | POA: Insufficient documentation

## 2023-11-25 DIAGNOSIS — E039 Hypothyroidism, unspecified: Secondary | ICD-10-CM | POA: Diagnosis not present

## 2023-11-25 DIAGNOSIS — O99213 Obesity complicating pregnancy, third trimester: Secondary | ICD-10-CM | POA: Diagnosis not present

## 2023-11-25 DIAGNOSIS — J45909 Unspecified asthma, uncomplicated: Secondary | ICD-10-CM | POA: Diagnosis not present

## 2023-11-25 DIAGNOSIS — O09293 Supervision of pregnancy with other poor reproductive or obstetric history, third trimester: Secondary | ICD-10-CM | POA: Insufficient documentation

## 2023-11-25 DIAGNOSIS — E669 Obesity, unspecified: Secondary | ICD-10-CM

## 2023-11-25 DIAGNOSIS — Z3A33 33 weeks gestation of pregnancy: Secondary | ICD-10-CM | POA: Diagnosis not present

## 2023-11-27 ENCOUNTER — Other Ambulatory Visit: Payer: Self-pay | Admitting: Maternal & Fetal Medicine

## 2023-11-27 DIAGNOSIS — O365990 Maternal care for other known or suspected poor fetal growth, unspecified trimester, not applicable or unspecified: Secondary | ICD-10-CM

## 2023-12-01 ENCOUNTER — Ambulatory Visit: Payer: Medicaid Other | Attending: Maternal & Fetal Medicine

## 2023-12-01 ENCOUNTER — Other Ambulatory Visit: Payer: Self-pay

## 2023-12-01 ENCOUNTER — Ambulatory Visit: Payer: Medicaid Other | Admitting: *Deleted

## 2023-12-01 VITALS — BP 124/66 | HR 82

## 2023-12-01 DIAGNOSIS — O133 Gestational [pregnancy-induced] hypertension without significant proteinuria, third trimester: Secondary | ICD-10-CM

## 2023-12-01 DIAGNOSIS — O99213 Obesity complicating pregnancy, third trimester: Secondary | ICD-10-CM

## 2023-12-01 DIAGNOSIS — O365990 Maternal care for other known or suspected poor fetal growth, unspecified trimester, not applicable or unspecified: Secondary | ICD-10-CM | POA: Diagnosis present

## 2023-12-01 DIAGNOSIS — O09293 Supervision of pregnancy with other poor reproductive or obstetric history, third trimester: Secondary | ICD-10-CM

## 2023-12-01 DIAGNOSIS — O99283 Endocrine, nutritional and metabolic diseases complicating pregnancy, third trimester: Secondary | ICD-10-CM

## 2023-12-01 DIAGNOSIS — Z3A34 34 weeks gestation of pregnancy: Secondary | ICD-10-CM

## 2023-12-01 DIAGNOSIS — E039 Hypothyroidism, unspecified: Secondary | ICD-10-CM

## 2023-12-01 DIAGNOSIS — O365930 Maternal care for other known or suspected poor fetal growth, third trimester, not applicable or unspecified: Secondary | ICD-10-CM | POA: Insufficient documentation

## 2023-12-01 DIAGNOSIS — J45909 Unspecified asthma, uncomplicated: Secondary | ICD-10-CM

## 2023-12-01 DIAGNOSIS — E669 Obesity, unspecified: Secondary | ICD-10-CM | POA: Diagnosis not present

## 2023-12-01 DIAGNOSIS — O99513 Diseases of the respiratory system complicating pregnancy, third trimester: Secondary | ICD-10-CM

## 2023-12-01 NOTE — Procedures (Signed)
Shannon [name] February 21, 2006 [redacted]w[redacted]d  Fetus A Non-Stress Test Interpretation for 12/01/23  Indication: IUGR  Fetal Heart Rate A Mode: External Baseline Rate (A): 130 bpm Variability: Moderate Accelerations: 15 x 15 Decelerations: None Multiple birth?: No  Uterine Activity Mode: Palpation, Toco Contraction Frequency (min): None Resting Tone Palpated: Relaxed Resting Time: Adequate  Interpretation (Fetal Testing) Nonstress Test Interpretation: Reactive Comments: Dr. Parke Poisson reviewed tracing.

## 2023-12-02 ENCOUNTER — Ambulatory Visit: Payer: Medicaid Other

## 2023-12-02 ENCOUNTER — Other Ambulatory Visit: Payer: Medicaid Other

## 2023-12-03 ENCOUNTER — Ambulatory Visit (INDEPENDENT_AMBULATORY_CARE_PROVIDER_SITE_OTHER): Payer: Medicaid Other | Admitting: Obstetrics & Gynecology

## 2023-12-03 VITALS — BP 126/81 | HR 87 | Wt 173.4 lb

## 2023-12-03 DIAGNOSIS — O365990 Maternal care for other known or suspected poor fetal growth, unspecified trimester, not applicable or unspecified: Secondary | ICD-10-CM

## 2023-12-03 DIAGNOSIS — O365930 Maternal care for other known or suspected poor fetal growth, third trimester, not applicable or unspecified: Secondary | ICD-10-CM

## 2023-12-03 DIAGNOSIS — O09299 Supervision of pregnancy with other poor reproductive or obstetric history, unspecified trimester: Secondary | ICD-10-CM

## 2023-12-03 DIAGNOSIS — Z3480 Encounter for supervision of other normal pregnancy, unspecified trimester: Secondary | ICD-10-CM

## 2023-12-03 DIAGNOSIS — O133 Gestational [pregnancy-induced] hypertension without significant proteinuria, third trimester: Secondary | ICD-10-CM

## 2023-12-03 DIAGNOSIS — Z3A34 34 weeks gestation of pregnancy: Secondary | ICD-10-CM

## 2023-12-03 NOTE — Progress Notes (Signed)
   PRENATAL VISIT NOTE  Subjective:  Shannon [name] is a 17 y.o. G2P1001 at [redacted]w[redacted]d being seen today for ongoing prenatal care.  She is currently monitored for the following issues for this high-risk pregnancy and has Hearing impaired; Mild persistent asthma; Supervision of other normal pregnancy, antepartum; Hx of preeclampsia, prior pregnancy, currently pregnant; History of delivery by vacuum extraction, currently pregnant; Hypothyroidism; Scoliosis; Gestational hypertension; Fetal growth restriction antepartum; and Pregnancy complicated by obesity on their problem list.  Patient reports no complaints.   .  .   . Denies leaking of fluid.   The following portions of the patient's history were reviewed and updated as appropriate: allergies, current medications, past family history, past medical history, past social history, past surgical history and problem list.   Objective:  There were no vitals filed for this visit.  Fetal Status:           General:  Alert, oriented and cooperative. Patient is in no acute distress.  Skin: Skin is warm and dry. No rash noted.   Cardiovascular: Normal heart rate noted  Respiratory: Normal respiratory effort, no problems with respiration noted  Abdomen: Soft, gravid, appropriate for gestational age.        Pelvic: Cervical exam deferred        Extremities: Normal range of motion.     Mental Status: Normal mood and affect. Normal behavior. Normal judgment and thought content.   Assessment and Plan:  Pregnancy: G2P1001 at [redacted]w[redacted]d 1. Gestational hypertension, third trimester CHTN well controlled  2. Supervision of other normal pregnancy, antepartum F/u MFM weekly, dopplers nl  3. Hx of preeclampsia, prior pregnancy, currently pregnant   4. Fetal growth restriction antepartum   Preterm labor symptoms and general obstetric precautions including but not limited to vaginal bleeding, contractions, leaking of fluid and fetal movement were reviewed in  detail with the patient. Please refer to After Visit Summary for other counseling recommendations.   Return in about 2 weeks (around 12/17/2023).  Future Appointments  Date Time Provider Department Center  12/08/2023 10:30 AM [facility] NURSE [facility]  12/08/2023 10:45 AM [facility] NST [facility]  12/08/2023 11:30 AM [facility] US6 [facility]  12/15/2023  1:15 PM [facility] NURSE [facility]  12/15/2023  1:30 PM [facility] US5 [facility]  12/15/2023  2:15 PM [facility] NST [facility]  12/18/2023  9:55 AM Warden Fillers, MD [facility] None  12/25/2023 10:55 AM Allie Bossier, MD [facility] None  01/01/2024 10:55 AM Warden Fillers, MD [facility] None    Scheryl Darter, MD

## 2023-12-08 ENCOUNTER — Other Ambulatory Visit: Payer: Self-pay | Admitting: Obstetrics and Gynecology

## 2023-12-08 ENCOUNTER — Ambulatory Visit: Payer: Medicaid Other | Admitting: *Deleted

## 2023-12-08 ENCOUNTER — Ambulatory Visit: Payer: Medicaid Other | Attending: Obstetrics | Admitting: Obstetrics

## 2023-12-08 ENCOUNTER — Other Ambulatory Visit: Payer: Self-pay

## 2023-12-08 ENCOUNTER — Ambulatory Visit: Payer: Medicaid Other | Attending: Obstetrics and Gynecology

## 2023-12-08 VITALS — BP 158/87 | HR 78

## 2023-12-08 VITALS — BP 157/94 | HR 88

## 2023-12-08 DIAGNOSIS — O365990 Maternal care for other known or suspected poor fetal growth, unspecified trimester, not applicable or unspecified: Secondary | ICD-10-CM

## 2023-12-08 DIAGNOSIS — O133 Gestational [pregnancy-induced] hypertension without significant proteinuria, third trimester: Secondary | ICD-10-CM | POA: Diagnosis present

## 2023-12-08 DIAGNOSIS — O365930 Maternal care for other known or suspected poor fetal growth, third trimester, not applicable or unspecified: Secondary | ICD-10-CM

## 2023-12-08 DIAGNOSIS — Z3A35 35 weeks gestation of pregnancy: Secondary | ICD-10-CM | POA: Diagnosis not present

## 2023-12-08 DIAGNOSIS — O99513 Diseases of the respiratory system complicating pregnancy, third trimester: Secondary | ICD-10-CM | POA: Diagnosis not present

## 2023-12-08 DIAGNOSIS — O09293 Supervision of pregnancy with other poor reproductive or obstetric history, third trimester: Secondary | ICD-10-CM | POA: Insufficient documentation

## 2023-12-08 DIAGNOSIS — J45909 Unspecified asthma, uncomplicated: Secondary | ICD-10-CM | POA: Diagnosis not present

## 2023-12-08 DIAGNOSIS — Z3490 Encounter for supervision of normal pregnancy, unspecified, unspecified trimester: Secondary | ICD-10-CM | POA: Insufficient documentation

## 2023-12-08 DIAGNOSIS — O99213 Obesity complicating pregnancy, third trimester: Secondary | ICD-10-CM | POA: Insufficient documentation

## 2023-12-08 DIAGNOSIS — O1493 Unspecified pre-eclampsia, third trimester: Secondary | ICD-10-CM

## 2023-12-08 DIAGNOSIS — O365931 Maternal care for other known or suspected poor fetal growth, third trimester, fetus 1: Secondary | ICD-10-CM | POA: Insufficient documentation

## 2023-12-08 DIAGNOSIS — E669 Obesity, unspecified: Secondary | ICD-10-CM | POA: Diagnosis not present

## 2023-12-08 DIAGNOSIS — O99283 Endocrine, nutritional and metabolic diseases complicating pregnancy, third trimester: Secondary | ICD-10-CM

## 2023-12-08 DIAGNOSIS — E039 Hypothyroidism, unspecified: Secondary | ICD-10-CM

## 2023-12-08 NOTE — Progress Notes (Addendum)
MFM Note  Shannon [name] is currently at 35 weeks and 1 day.  She has been followed due to severe IUGR and gestational hypertension/preeclampsia that is treated with labetalol.  She denies any problems since her last exam.    Her blood pressures today were 158/87 and 157/94.    On today's exam, the EFW of 4 pounds 4 ounces measures at the 2nd percentile for her gestational age indicating severe IUGR.    The total AFI was 13.68 cm (within normal limits).  A BPP performed today was 10 out of 10 with a reactive NST.  Doppler studies of the umbilical arteries showed a normal S/D ratio of 2.56 .  There were no signs of absent or reversed end-diastolic flow.    Due to severe IUGR with gestational hypertension/preeclampsia, delivery is recommended at around 36 weeks (next week).  Preeclampsia precautions were reviewed today.  She was advised to go to the hospital should she complain of any signs or symptoms of preeclampsia.  She will return in 1 week for another BPP/NST and umbilical artery Doppler study if she remains undelivered.    The patient stated that all of her questions were answered today.  A total of 20 minutes was spent counseling and coordinating the care for this patient.  Greater than 50% of the time was spent in direct face-to-face contact.

## 2023-12-08 NOTE — Progress Notes (Unsigned)
   Induction Assessment Scheduling Form: Fax to [facility]:  [phone]  Shannon [name]                                                                                   DOB:  03-Nov-2006                                                            MRN:  [id number]                                                                     Phone #:   [phone]                         Provider:  [facility]  GP:  [id number]                                                            Estimated Date of Delivery: 01/11/24  Dating Criteria: [redacted]w[redacted]d sonogram    Medical Indications for induction:  FGR (2%) with mild pre eclampsia Admission Date/Time:  12/16/23 am Gestational age on admission:  [redacted]w[redacted]d   There were no vitals filed for this visit. HIV:  Non Reactive (10/31 0957) GBS:    Cervix not assessed   Method of induction(proposed):  choice   Scheduling Provider Signature:  Lazaro Arms, MD        Note from Dr Parke Poisson:        Hi Dr Despina Hidden   This is the patient that we spoke about on the phone.    She has severe IUGR with an EFW of 4 pounds 4 ounces (2nd percentile) and gestational hypertension/preeclampsia.   Can you schedule her for an induction sometime next week (prefers Tuesday).    Thank you  Alecia Lemming                                  [id number] Date:  12/08/2023  12/08/2023 1:58 PM

## 2023-12-08 NOTE — Procedures (Signed)
Shannon [name] 09/11/2006 [redacted]w[redacted]d  Fetus A Non-Stress Test Interpretation for 12/08/23  Indication: IUGR  Fetal Heart Rate A Mode: External Baseline Rate (A): 135 bpm Variability: Moderate Accelerations: 15 x 15 Decelerations: None Multiple birth?: No  Uterine Activity Mode: Palpation, Toco Contraction Frequency (min): Occas Contraction Quality: Mild Resting Tone Palpated: Relaxed Resting Time: Adequate  Interpretation (Fetal Testing) Nonstress Test Interpretation: Reactive Comments: Dr. Parke Poisson reviewed tracing.

## 2023-12-09 ENCOUNTER — Encounter (HOSPITAL_COMMUNITY): Payer: Self-pay

## 2023-12-09 ENCOUNTER — Telehealth (HOSPITAL_COMMUNITY): Payer: Self-pay | Admitting: *Deleted

## 2023-12-09 NOTE — Telephone Encounter (Signed)
Preadmission screen  

## 2023-12-10 ENCOUNTER — Telehealth (HOSPITAL_COMMUNITY): Payer: Self-pay | Admitting: *Deleted

## 2023-12-10 ENCOUNTER — Encounter (HOSPITAL_COMMUNITY): Payer: Self-pay | Admitting: Obstetrics & Gynecology

## 2023-12-10 ENCOUNTER — Ambulatory Visit (INDEPENDENT_AMBULATORY_CARE_PROVIDER_SITE_OTHER): Payer: Medicaid Other | Admitting: *Deleted

## 2023-12-10 ENCOUNTER — Encounter (HOSPITAL_COMMUNITY): Payer: Self-pay | Admitting: *Deleted

## 2023-12-10 ENCOUNTER — Inpatient Hospital Stay (HOSPITAL_COMMUNITY)
Admission: AD | Admit: 2023-12-10 | Discharge: 2023-12-14 | DRG: 807 | Disposition: A | Discharge status: Home / Self Care | Payer: Medicaid Other | Attending: Obstetrics & Gynecology | Admitting: Obstetrics & Gynecology

## 2023-12-10 ENCOUNTER — Other Ambulatory Visit: Payer: Self-pay

## 2023-12-10 ENCOUNTER — Other Ambulatory Visit: Payer: Self-pay | Admitting: Advanced Practice Midwife

## 2023-12-10 VITALS — BP 164/110 | HR 92

## 2023-12-10 DIAGNOSIS — Z3A35 35 weeks gestation of pregnancy: Secondary | ICD-10-CM

## 2023-12-10 DIAGNOSIS — E039 Hypothyroidism, unspecified: Secondary | ICD-10-CM | POA: Diagnosis present

## 2023-12-10 DIAGNOSIS — O99284 Endocrine, nutritional and metabolic diseases complicating childbirth: Secondary | ICD-10-CM | POA: Diagnosis present

## 2023-12-10 DIAGNOSIS — O9952 Diseases of the respiratory system complicating childbirth: Secondary | ICD-10-CM | POA: Diagnosis present

## 2023-12-10 DIAGNOSIS — Z3490 Encounter for supervision of normal pregnancy, unspecified, unspecified trimester: Secondary | ICD-10-CM | POA: Diagnosis present

## 2023-12-10 DIAGNOSIS — Z7982 Long term (current) use of aspirin: Secondary | ICD-10-CM | POA: Diagnosis not present

## 2023-12-10 DIAGNOSIS — Z833 Family history of diabetes mellitus: Secondary | ICD-10-CM | POA: Diagnosis not present

## 2023-12-10 DIAGNOSIS — Z3480 Encounter for supervision of other normal pregnancy, unspecified trimester: Secondary | ICD-10-CM

## 2023-12-10 DIAGNOSIS — Z0131 Encounter for examination of blood pressure with abnormal findings: Secondary | ICD-10-CM | POA: Diagnosis not present

## 2023-12-10 DIAGNOSIS — Z8249 Family history of ischemic heart disease and other diseases of the circulatory system: Secondary | ICD-10-CM

## 2023-12-10 DIAGNOSIS — J4530 Mild persistent asthma, uncomplicated: Secondary | ICD-10-CM | POA: Diagnosis present

## 2023-12-10 DIAGNOSIS — O09299 Supervision of pregnancy with other poor reproductive or obstetric history, unspecified trimester: Secondary | ICD-10-CM

## 2023-12-10 DIAGNOSIS — O365930 Maternal care for other known or suspected poor fetal growth, third trimester, not applicable or unspecified: Secondary | ICD-10-CM | POA: Diagnosis present

## 2023-12-10 DIAGNOSIS — Z79890 Hormone replacement therapy (postmenopausal): Secondary | ICD-10-CM | POA: Diagnosis not present

## 2023-12-10 DIAGNOSIS — O365990 Maternal care for other known or suspected poor fetal growth, unspecified trimester, not applicable or unspecified: Secondary | ICD-10-CM | POA: Diagnosis present

## 2023-12-10 DIAGNOSIS — O1413 Severe pre-eclampsia, third trimester: Principal | ICD-10-CM

## 2023-12-10 DIAGNOSIS — O1414 Severe pre-eclampsia complicating childbirth: Secondary | ICD-10-CM | POA: Diagnosis not present

## 2023-12-10 DIAGNOSIS — O133 Gestational [pregnancy-induced] hypertension without significant proteinuria, third trimester: Secondary | ICD-10-CM

## 2023-12-10 DIAGNOSIS — R030 Elevated blood-pressure reading, without diagnosis of hypertension: Secondary | ICD-10-CM | POA: Diagnosis present

## 2023-12-10 LAB — COMPREHENSIVE METABOLIC PANEL
ALT: 9 U/L (ref 0–44)
AST: 21 U/L (ref 15–41)
Albumin: 2.6 g/dL — ABNORMAL LOW (ref 3.5–5.0)
Alkaline Phosphatase: 120 U/L (ref 38–126)
Anion gap: 9 (ref 5–15)
BUN: 6 mg/dL (ref 6–20)
CO2: 21 mmol/L — ABNORMAL LOW (ref 22–32)
Calcium: 8.6 mg/dL — ABNORMAL LOW (ref 8.9–10.3)
Chloride: 105 mmol/L (ref 98–111)
Creatinine, Ser: 0.47 mg/dL (ref 0.44–1.00)
GFR, Estimated: >60 mL/min (ref >60)
Glucose, Bld: 57 mg/dL — ABNORMAL LOW (ref 70–99)
Potassium: 3.8 mmol/L (ref 3.5–5.1)
Sodium: 135 mmol/L (ref 135–145)
Total Bilirubin: <0.2 mg/dL (ref <1.2)
Total Protein: 6.5 g/dL (ref 6.5–8.1)

## 2023-12-10 LAB — PROTEIN / CREATININE RATIO, URINE
Creatinine, Urine: 56 mg/dL
Protein Creatinine Ratio: 3.25 mg/mg{creat} — ABNORMAL HIGH (ref 0.00–0.15)
Total Protein, Urine: 182 mg/dL

## 2023-12-10 LAB — CBC
HCT: 33.1 % — ABNORMAL LOW (ref 36.0–46.0)
Hemoglobin: 10.6 g/dL — ABNORMAL LOW (ref 12.0–15.0)
MCH: 27.6 pg (ref 26.0–34.0)
MCHC: 32 g/dL (ref 30.0–36.0)
MCV: 86.2 fL (ref 80.0–100.0)
Platelets: 248 10*3/uL (ref 150–400)
RBC: 3.84 MIL/uL — ABNORMAL LOW (ref 3.87–5.11)
RDW: 15.8 % — ABNORMAL HIGH (ref 11.5–15.5)
WBC: 7.5 10*3/uL (ref 4.0–10.5)
nRBC: 0.3 % — ABNORMAL HIGH (ref 0.0–0.2)

## 2023-12-10 LAB — TYPE AND SCREEN
ABO/RH(D): O POS
Antibody Screen: NEGATIVE

## 2023-12-10 MED ORDER — ACETAMINOPHEN 325 MG PO TABS
650.0000 mg | ORAL_TABLET | ORAL | Status: DC | PRN
  Administered 2023-12-11: 650 mg via ORAL
  Filled 2023-12-10: qty 2

## 2023-12-10 MED ORDER — MAGNESIUM SULFATE BOLUS VIA INFUSION
4.0000 g | Freq: Once | INTRAVENOUS | Status: AC
  Administered 2023-12-10: 4 g via INTRAVENOUS
  Filled 2023-12-10: qty 1000

## 2023-12-10 MED ORDER — TERBUTALINE SULFATE 1 MG/ML IJ SOLN: 0.2500 mg | Freq: Once | INTRAMUSCULAR | Status: DC | PRN

## 2023-12-10 MED ORDER — MISOPROSTOL 25 MCG QUARTER TABLET
25.0000 ug | ORAL_TABLET | Freq: Once | ORAL | Status: DC
  Filled 2023-12-10: qty 1

## 2023-12-10 MED ORDER — OXYCODONE-ACETAMINOPHEN 5-325 MG PO TABS: 1.0000 | ORAL_TABLET | ORAL | Status: DC | PRN

## 2023-12-10 MED ORDER — NIFEDIPINE 10 MG PO CAPS
20.0000 mg | ORAL_CAPSULE | ORAL | Status: DC | PRN
  Filled 2023-12-10: qty 2

## 2023-12-10 MED ORDER — MAGNESIUM SULFATE 40 GM/1000ML IV SOLN
2.0000 g/h | INTRAVENOUS | Status: DC
Start: 2023-12-10 — End: 2023-12-12
  Administered 2023-12-11 – 2023-12-12 (×2): 2 g/h via INTRAVENOUS
  Filled 2023-12-10 (×3): qty 1000

## 2023-12-10 MED ORDER — LABETALOL HCL 200 MG PO TABS
200.0000 mg | ORAL_TABLET | Freq: Two times a day (BID) | ORAL | Status: DC
  Administered 2023-12-10 – 2023-12-11 (×3): 200 mg via ORAL
  Filled 2023-12-10 (×4): qty 1

## 2023-12-10 MED ORDER — OXYTOCIN BOLUS FROM INFUSION
333.0000 mL | Freq: Once | INTRAVENOUS | Status: AC
  Administered 2023-12-11: 333 mL via INTRAVENOUS

## 2023-12-10 MED ORDER — LIDOCAINE HCL (PF) 1 % IJ SOLN: 30.0000 mL | INTRAMUSCULAR | Status: DC | PRN

## 2023-12-10 MED ORDER — LACTATED RINGERS IV SOLN: 500.0000 mL | INTRAVENOUS | Status: DC | PRN

## 2023-12-10 MED ORDER — BETAMETHASONE SOD PHOS & ACET 6 (3-3) MG/ML IJ SUSP
12.0000 mg | INTRAMUSCULAR | Status: AC
  Administered 2023-12-10: 12 mg via INTRAMUSCULAR
  Filled 2023-12-10: qty 5

## 2023-12-10 MED ORDER — SOD CITRATE-CITRIC ACID 500-334 MG/5ML PO SOLN: 30.0000 mL | ORAL | Status: DC | PRN

## 2023-12-10 MED ORDER — SODIUM CHLORIDE 0.9% FLUSH
10.0000 mL | Freq: Two times a day (BID) | INTRAVENOUS | Status: DC
  Administered 2023-12-11 – 2023-12-14 (×3): 10 mL via INTRAVENOUS

## 2023-12-10 MED ORDER — OXYTOCIN-SODIUM CHLORIDE 30-0.9 UT/500ML-% IV SOLN
1.0000 m[IU]/min | INTRAVENOUS | Status: DC
  Administered 2023-12-11: 2 m[IU]/min via INTRAVENOUS

## 2023-12-10 MED ORDER — PROCHLORPERAZINE EDISYLATE 10 MG/2ML IJ SOLN
10.0000 mg | Freq: Once | INTRAMUSCULAR | Status: AC
  Administered 2023-12-10: 10 mg via INTRAVENOUS
  Filled 2023-12-10: qty 2

## 2023-12-10 MED ORDER — NIFEDIPINE 10 MG PO CAPS
20.0000 mg | ORAL_CAPSULE | ORAL | Status: DC | PRN
  Administered 2023-12-10: 20 mg via ORAL

## 2023-12-10 MED ORDER — PREGABALIN 25 MG PO CAPS
25.0000 mg | ORAL_CAPSULE | Freq: Two times a day (BID) | ORAL | Status: DC
  Administered 2023-12-10 – 2023-12-14 (×8): 25 mg via ORAL
  Filled 2023-12-10 (×9): qty 1

## 2023-12-10 MED ORDER — LACTATED RINGERS IV SOLN: INTRAVENOUS | Status: DC

## 2023-12-10 MED ORDER — OXYTOCIN-SODIUM CHLORIDE 30-0.9 UT/500ML-% IV SOLN
2.5000 [IU]/h | INTRAVENOUS | Status: DC
  Filled 2023-12-10: qty 500

## 2023-12-10 MED ORDER — LABETALOL HCL 5 MG/ML IV SOLN: 40.0000 mg | INTRAVENOUS | Status: DC | PRN

## 2023-12-10 MED ORDER — FENTANYL CITRATE (PF) 100 MCG/2ML IJ SOLN
50.0000 ug | INTRAMUSCULAR | Status: DC | PRN
  Filled 2023-12-10: qty 2

## 2023-12-10 MED ORDER — NIFEDIPINE 10 MG PO CAPS
10.0000 mg | ORAL_CAPSULE | ORAL | Status: DC | PRN
  Administered 2023-12-10: 10 mg via ORAL
  Filled 2023-12-10: qty 1

## 2023-12-10 MED ORDER — DIPHENHYDRAMINE HCL 50 MG/ML IJ SOLN
25.0000 mg | INTRAMUSCULAR | Status: AC
  Administered 2023-12-10: 25 mg via INTRAVENOUS
  Filled 2023-12-10: qty 1

## 2023-12-10 MED ORDER — OXYCODONE-ACETAMINOPHEN 5-325 MG PO TABS
2.0000 | ORAL_TABLET | ORAL | Status: DC | PRN
  Administered 2023-12-11: 2 via ORAL
  Filled 2023-12-10: qty 2

## 2023-12-10 MED ORDER — ONDANSETRON HCL 4 MG/2ML IJ SOLN
4.0000 mg | Freq: Four times a day (QID) | INTRAMUSCULAR | Status: DC | PRN
  Administered 2023-12-11: 4 mg via INTRAVENOUS
  Filled 2023-12-10: qty 2

## 2023-12-10 MED ORDER — LEVOTHYROXINE SODIUM 25 MCG PO TABS
50.0000 ug | ORAL_TABLET | Freq: Every day | ORAL | Status: DC
Start: 2023-12-11 — End: 2023-12-14
  Administered 2023-12-11 – 2023-12-14 (×4): 50 ug via ORAL
  Filled 2023-12-10: qty 2
  Filled 2023-12-10 (×2): qty 1
  Filled 2023-12-10 (×2): qty 2

## 2023-12-10 MED ORDER — BETAMETHASONE SOD PHOS & ACET 6 (3-3) MG/ML IJ SUSP: 12.0000 mg | Freq: Once | INTRAMUSCULAR | Status: DC

## 2023-12-10 MED ORDER — LACTATED RINGERS IV BOLUS
1000.0000 mL | Freq: Once | INTRAVENOUS | Status: AC
  Administered 2023-12-10: 1000 mL via INTRAVENOUS

## 2023-12-10 MED ORDER — LACTATED RINGERS IV SOLN: INTRAVENOUS | Status: AC

## 2023-12-10 NOTE — MAU Note (Signed)
 Pt informed that the ultrasound is considered a limited OB ultrasound and is not intended to be a complete ultrasound exam.  Patient also informed that the ultrasound is not being completed with the intent of assessing for fetal or placental anomalies or any pelvic abnormalities.  Explained that the purpose of today's ultrasound is to assess for  presentation.  Patient acknowledges the purpose of the exam and the limitations of the study.     Vertex verified by certified RN

## 2023-12-10 NOTE — MAU Note (Signed)
Shannon [name] is a 17 y.o. at [redacted]w[redacted]d here in MAU reporting: sent over from the office, BP was elevated, severe range.  Had a persistent HA since yesterday.  Has not taken anything for it. Seeing some floaters, denies epigastric pain, reports increase in swelling.  Denies bleeding or LOF. Reports +FM  Onset of complaint: HA since yesterday Pain score: 6 Vitals:   12/10/23 1723  BP: (!) 156/107  Pulse: 86  Resp: 16  Temp: 98.3 F (36.8 C)  SpO2: 99%     FHT:146 Lab orders placed from triage:  urine pcr

## 2023-12-10 NOTE — Progress Notes (Signed)
Subjective:  Shannon [name] is a 17 y.o. female here for BP check.   Hypertension ROS: taking medications as instructed, no medication side effects noted, no TIA's, no chest pain on exertion, no dyspnea on exertion, and no swelling of ankles.    Objective:  BP (!) 147/94   Pulse 99   LMP 04/06/2023 (Exact Date) Comment: pt reports normal for her to have irregular periods  Appearance alert, well appearing, and in no distress and oriented to person, place, and time. General exam BP noted to be well controlled today in office.    Assessment:   Blood Pressure poorly controlled.   Plan:  Orders and follow up as documented in patient record.Shannon [name] reports HA X 24 hours. Has not taken Tylenol. She reports intermittent elevated BP readings over the last few days. Reports "spotted vision." Denies dizziness or RUQ abdominal pain. Reports "cramping" consistently.  Dr. Jolayne Panther was consulted. Recommendation for pt to go to MAU for severe range BP. Pt verbalized understanding.

## 2023-12-10 NOTE — H&P (Cosign Needed Addendum)
OBSTETRIC ADMISSION HISTORY AND PHYSICAL  Shannon [name] is a 17 y.o. female G2P1001 with IUP at [redacted]w[redacted]d by LMP presenting due to elevated blood pressures, admitted for IOL due to severe preeclampsia with proteinuria, severe headache, floaters, swelling. She reports +FMs, No LOF, no VB.  She plans on breast feeding. She request nexplanon for birth control. She received her prenatal care at [facility]  Dating: By LMP --->  Estimated Date of Delivery: 01/11/24  Sono:    @[redacted]w[redacted]d , IUGR, normal anatomy, cephalic presentation, posterior placenta, EFW: 1941 gm, 4 lb 4 oz, 2 %    Prenatal History/Complications: IUGR, hypothyroidism, gHTN; hx pre-e with prev pregnancy; hx VAVD @ term  Past Medical History: Past Medical History:  Diagnosis Date   Allergic conjunctivitis 02/23/2018   Asthma    Hard of hearing    Hearing loss    Hypothyroidism    Impaired hearing    Otitis    Pregnancy complicated by obesity 11/06/2023   Pregnancy induced hypertension    Scoliosis     Past Surgical History: Past Surgical History:  Procedure Laterality Date   ADENOIDECTOMY     INCISION AND DRAINAGE ABSCESS Left 08/18/2014   Procedure: INCISION AND DRAINAGE ABSCESS - Left Arm;  Surgeon: Judie Petit. [name], MD;  Location: [facility] OR;  Service: Pediatrics;  Laterality: Left;   TONSILLECTOMY      Obstetrical History: OB History     Gravida  2   Para  1   Term  1   Preterm  0   AB  0   Living  1      SAB  0   IAB  0   Ectopic  0   Multiple      Live Births  1           Social History Social History   Socioeconomic History   Marital status: Married    Spouse name: Not on file   Number of children: Not on file   Years of education: Not on file   Highest education level: Not on file  Occupational History   Not on file  Tobacco Use   Smoking status: Never    Passive exposure: Yes   Smokeless tobacco: Never   Tobacco comments:    mom and step dad smokes outside  Vaping Use    Vaping status: Never Used  Substance and Sexual Activity   Alcohol use: Never   Drug use: Never   Sexual activity: Not Currently    Birth control/protection: None    Comment: last ic 3 days ago  Other Topics Concern   Not on file  Social History Narrative   ** Merged History Encounter **       Lives with mom and 1 brother. She is in the 9th grade but not currently in school   Social Drivers of Health   Financial Resource Strain: Not on file  Food Insecurity: No Food Insecurity (12/10/2023)   Hunger Vital Sign    Worried About Running Out of Food in the Last Year: Never true    Ran Out of Food in the Last Year: Never true  Transportation Needs: No Transportation Needs (12/10/2023)   PRAPARE - Administrator, Civil Service (Medical): No    Lack of Transportation (Non-Medical): No  Physical Activity: Not on file  Stress: Not on file  Social Connections: Not on file    Family History: Family History  Problem Relation Age of Onset  Migraines Mother    Anxiety disorder Mother    Depression Mother    Migraines Brother    Autism Brother    ADD / ADHD Neg Hx    Bipolar disorder Neg Hx    Schizophrenia Neg Hx    Diabetes Maternal Grandmother    Hypertension Maternal Grandmother    Heart disease Maternal Grandmother    Learning disabilities Brother    Asthma Brother    Allergic rhinitis Brother    Learning disabilities Maternal Uncle    Mental illness Maternal Uncle    Mental retardation Maternal Uncle    Healthy Mother    Healthy Father    Eczema Neg Hx     Allergies: Allergies  Allergen Reactions   Egg-Derived Products Other (See Comments)    Severe ab pain  Other Reaction(s): Other (See Comments)    Medications Prior to Admission  Medication Sig Dispense Refill Last Dose/Taking   aspirin 81 MG chewable tablet Chew 1 tablet (81 mg total) by mouth daily. Start at 15 weeks 30 tablet 5 12/10/2023   cyclobenzaprine (FLEXERIL) 5 MG tablet Take 5 mg by  mouth 3 (three) times daily as needed for muscle spasms.   12/09/2023   HYDROcodone-acetaminophen (NORCO/VICODIN) 5-325 MG tablet Take 1 tablet by mouth every 6 (six) hours as needed for moderate pain (pain score 4-6).   Past Week   labetalol (NORMODYNE) 100 MG tablet Take 2 tablets (200 mg total) by mouth 2 (two) times daily. 120 tablet 1 12/10/2023   levothyroxine (SYNTHROID) 50 MCG tablet Take 50 mcg by mouth daily.   12/10/2023   ondansetron (ZOFRAN) 4 MG tablet Take 1 tablet (4 mg total) by mouth every 8 (eight) hours as needed for nausea or vomiting. 20 tablet 0 12/10/2023   pregabalin (LYRICA) 25 MG capsule Take 25 mg by mouth 2 (two) times daily.   12/10/2023   Prenatal Vit-Fe Fumarate-FA (PRENATAL VITAMIN PO) Take 1 tablet by mouth daily.   12/10/2023   promethazine (PHENERGAN) 25 MG tablet Take 1 tablet (25 mg total) by mouth every 6 (six) hours as needed for nausea or vomiting. 30 tablet 2 12/09/2023   promethazine (PHENERGAN) 25 MG tablet Take 1 tablet (25 mg total) by mouth every 6 (six) hours as needed for nausea or vomiting. 30 tablet 0      Review of Systems   All systems reviewed and negative except as stated in HPI  Blood pressure 126/79, pulse 95, temperature 97.9 F (36.6 C), temperature source Oral, resp. rate 18, height 4\' 9"  (1.448 m), weight 78.8 kg, last menstrual period 04/06/2023, SpO2 99%. General appearance: alert Lungs: clear to auscultation bilaterally Heart: regular rate and rhythm Abdomen: soft, non-tender; bowel sounds normal Extremities: Homans sign is negative, no sign of DVT Presentation: cephalic Fetal monitoringBaseline: 135 bpm, Variability: Good {> 6 bpm), Accelerations: Reactive, and Decelerations: Absent Uterine activity: irregular Dilation: 1 Effacement (%): 50 Station: -2 Exam by:: Philipp Deputy, CNM   Prenatal labs: ABO, Rh: --/--/O POS (12/18 1928) Antibody: NEG (12/18 1928) Rubella: 9.56 (06/27 1426) RPR: Non Reactive (10/31 0957)   HBsAg: Negative (06/27 1426)  HIV: Non Reactive (10/31 0957)  GBS: unknown> txt with  1 hr Glucola normal Genetic screening  NIPS: low risk female Anatomy US normal  Prenatal Transfer Tool  Maternal Diabetes: No Genetic Screening: Normal Maternal Ultrasounds/Referrals: IUGR Fetal Ultrasounds or other Referrals:  Referred to Materal Fetal Medicine  Maternal Substance Abuse:  No Significant Maternal Medications:  Meds include: Syntroid  Significant Maternal Lab Results:  Other: GBS unknown Number of Prenatal Visits:greater than 3 verified prenatal visits Other Comments:  None  Results for orders placed or performed during the hospital encounter of 12/10/23 (from the past 24 hours)  CBC   Collection Time: 12/10/23  5:38 PM  Result Value Ref Range   WBC 7.5 4.0 - 10.5 K/uL   RBC 3.84 (L) 3.87 - 5.11 MIL/uL   Hemoglobin 10.6 (L) 12.0 - 15.0 g/dL   HCT 16.1 (L) 09.6 - 04.5 %   MCV 86.2 80.0 - 100.0 fL   MCH 27.6 26.0 - 34.0 pg   MCHC 32.0 30.0 - 36.0 g/dL   RDW 40.9 (H) 81.1 - 91.4 %   Platelets 248 150 - 400 K/uL   nRBC 0.3 (H) 0.0 - 0.2 %  Comprehensive metabolic panel   Collection Time: 12/10/23  5:38 PM  Result Value Ref Range   Sodium 135 135 - 145 mmol/L   Potassium 3.8 3.5 - 5.1 mmol/L   Chloride 105 98 - 111 mmol/L   CO2 21 (L) 22 - 32 mmol/L   Glucose, Bld 57 (L) 70 - 99 mg/dL   BUN 6 6 - 20 mg/dL   Creatinine, Ser 7.82 0.44 - 1.00 mg/dL   Calcium 8.6 (L) 8.9 - 10.3 mg/dL   Total Protein 6.5 6.5 - 8.1 g/dL   Albumin 2.6 (L) 3.5 - 5.0 g/dL   AST 21 15 - 41 U/L   ALT 9 0 - 44 U/L   Alkaline Phosphatase 120 38 - 126 U/L   Total Bilirubin <0.2 <1.2 mg/dL   GFR, Estimated >95 >62 mL/min   Anion gap 9 5 - 15  Protein / creatinine ratio, urine   Collection Time: 12/10/23  5:55 PM  Result Value Ref Range   Creatinine, Urine 56 mg/dL   Total Protein, Urine 182 mg/dL   Protein Creatinine Ratio 3.25 (H) 0.00 - 0.15 mg/mg[Cre]  Type and screen   Collection Time:  12/10/23  7:28 PM  Result Value Ref Range   ABO/RH(D) O POS    Antibody Screen NEG    Sample Expiration      12/13/2023,2359 Performed at [facility], [address], [place], [place] [address]     Patient Active Problem List   Diagnosis Date Noted   Pregnancy complicated by obesity 11/06/2023   Fetal growth restriction antepartum 09/06/2023   Pre-eclampsia, severe 08/31/2023   Scoliosis 08/28/2023   Hx of preeclampsia, prior pregnancy, currently pregnant 07/03/2023   History of delivery by vacuum extraction, currently pregnant 07/03/2023   Hypothyroidism    Supervision of other normal pregnancy, antepartum 06/19/2023   Mild persistent asthma 02/23/2018   Hearing impaired 12/09/2014    Assessment/Plan:  Karaann Shugar Jefferys is a 17 y.o. G2P1001 at [redacted]w[redacted]d here for IOL due to preeclampsia   #Labor: Admit to L&D. Start Magnesium. Antihypertensives per preeclampsia protocol. Given late preterm status will give betamethasone q24h x2 as well as start GBS prophylaxis.  FB in @ 2215. Anticiapte SVD #Pain: Per patient request #FWB: Cat 1 #ID: GBS unknown, swab pending. PCN to be given  #MOF: Breast #MOC: Nexplanon #Circ:  Undecided  #Gestational HTN>Preeclampsia: Prior hx of preeclampsia in first pregnancy. On labetalol 200 BID. Presented to MAU after office visit with severe range BP and HA. HA treated and resolved after migraine cocktail. Labs WNL. Mag started.   #Severe IUGR: [place] on 12/16 indicated EFW of 4lbs, 4oz measuring at 2nd percentile. Normal  AFI. Normal dopplers  #Hypothyroidism: Continue home synthroid. Last TSH WNL 11/20    Gwenlyn Perking, MD  12/10/2023, 10:26 PM  CNM attestation:  I have seen and examined this patient; I agree with above documentation in the resident's note.   Kaleea E Botello is a 17 y.o. G2P1001 here for IOL due to pre-e w SF (BPs; sx; ^P/C ratio); also w dx of severe FGR with nl dopplers with previous plan for IOL @ 36wks   PE: BP  126/79   Pulse 95   Temp 97.9 F (36.6 C) (Oral)   Resp 18   Ht 4\' 9"  (1.448 m)   Wt 78.8 kg   LMP 04/06/2023 (Exact Date) Comment: pt reports normal for her to have irregular periods  SpO2 99%   BMI 37.59 kg/m  Gen: calm comfortable, NAD Resp: normal effort, no distress Abd: gravid  ROS, labs, PMH reviewed  Plan: -Admit to Labor and Delivery -Mag sulfate already started in MAU for seizure ppx -BMZ first dose given; rpt in 24h -GBS culture collected once on L&D for peds purposes (plan for PCN with Pit or ROM) -Cervical foley placed without difficulty and inflated w 60cc fluid; plan for Pitocin/AROM when it dislodges -Continue home Labetalol 200mg  bid with IV antihypertensives prn BPs in severe range -Anticipate preterm vag delivery  Arabella Merles CNM 12/10/2023, 10:51 PM

## 2023-12-10 NOTE — MAU Note (Signed)
RN called Blood bank and Main Lab to run the Type and Screen and RPR. RN drew the blood earlier and sent it to the lab without an order, but now the order is in.

## 2023-12-10 NOTE — MAU Provider Note (Signed)
History     CSN: [id number]  Arrival date and time: 12/10/23 1704   Event Date/Time   First Provider Initiated Contact with Patient 12/10/23 1936      Chief Complaint  Patient presents with   Headache   Hypertension   Foot Swelling   HPI [name] is a 17 y.o. year old G51P1001 female at [redacted]w[redacted]d weeks gestation who was sent to MAU from [facility] for elevated BP in severe ranges and persistent H/A since last night. She has not taken any medications for the H/A. She reports seeing floaters and has increased swelling. She denies epigastric pain, VB or LOF. She reports (+) FM. Her pregnancy has been complicated by an existing dx of PEC ; she takes Labetalol 200 mg BID; last dose was 1030 this AM. She receives [facility]. Her spouse is present and contributing to the history taking.     OB History     Gravida  2   Para  1   Term  1   Preterm  0   AB  0   Living  1      SAB  0   IAB  0   Ectopic  0   Multiple      Live Births  1           Past Medical History:  Diagnosis Date   Allergic conjunctivitis 02/23/2018   Asthma    Hard of hearing    Hearing loss    Hypothyroidism    Impaired hearing    Otitis    Pregnancy complicated by obesity 11/06/2023   Pregnancy induced hypertension    Scoliosis     Past Surgical History:  Procedure Laterality Date   ADENOIDECTOMY     INCISION AND DRAINAGE ABSCESS Left 08/18/2014   Procedure: INCISION AND DRAINAGE ABSCESS - Left Arm;  Surgeon: Judie Petit. [name], MD;  Location: [facility] OR;  Service: Pediatrics;  Laterality: Left;   TONSILLECTOMY      Family History  Problem Relation Age of Onset   Migraines Mother    Anxiety disorder Mother    Depression Mother    Migraines Brother    Autism Brother    ADD / ADHD Neg Hx    Bipolar disorder Neg Hx    Schizophrenia Neg Hx    Diabetes Maternal Grandmother    Hypertension Maternal Grandmother    Heart disease Maternal Grandmother    Learning disabilities  Brother    Asthma Brother    Allergic rhinitis Brother    Learning disabilities Maternal Uncle    Mental illness Maternal Uncle    Mental retardation Maternal Uncle    Healthy Mother    Healthy Father    Eczema Neg Hx     Social History   Tobacco Use   Smoking status: Never    Passive exposure: Yes   Smokeless tobacco: Never   Tobacco comments:    mom and step dad smokes outside  Vaping Use   Vaping status: Never Used  Substance Use Topics   Alcohol use: Never   Drug use: Never    Allergies:  Allergies  Allergen Reactions   Egg-Derived Products Other (See Comments)    Severe ab pain  Other Reaction(s): Other (See Comments)    Medications Prior to Admission  Medication Sig Dispense Refill Last Dose/Taking   aspirin 81 MG chewable tablet Chew 1 tablet (81 mg total) by mouth daily. Start at 15 weeks 30 tablet 5  12/10/2023   cyclobenzaprine (FLEXERIL) 5 MG tablet Take 5 mg by mouth 3 (three) times daily as needed for muscle spasms.   12/09/2023   HYDROcodone-acetaminophen (NORCO/VICODIN) 5-325 MG tablet Take 1 tablet by mouth every 6 (six) hours as needed for moderate pain (pain score 4-6).   Past Week   labetalol (NORMODYNE) 100 MG tablet Take 2 tablets (200 mg total) by mouth 2 (two) times daily. 120 tablet 1 12/10/2023   levothyroxine (SYNTHROID) 50 MCG tablet Take 50 mcg by mouth daily.   12/10/2023   ondansetron (ZOFRAN) 4 MG tablet Take 1 tablet (4 mg total) by mouth every 8 (eight) hours as needed for nausea or vomiting. 20 tablet 0 12/10/2023   pregabalin (LYRICA) 25 MG capsule Take 25 mg by mouth 2 (two) times daily.   12/10/2023   Prenatal Vit-Fe Fumarate-FA (PRENATAL VITAMIN PO) Take 1 tablet by mouth daily.   12/10/2023   promethazine (PHENERGAN) 25 MG tablet Take 1 tablet (25 mg total) by mouth every 6 (six) hours as needed for nausea or vomiting. 30 tablet 2 12/09/2023   promethazine (PHENERGAN) 25 MG tablet Take 1 tablet (25 mg total) by mouth every 6 (six)  hours as needed for nausea or vomiting. 30 tablet 0     Review of Systems  Constitutional: Negative.    Physical Exam   Patient Vitals for the past 24 hrs:  BP Temp Temp src Pulse Resp SpO2 Height Weight  12/10/23 1945 (!) 110/51 -- -- (!) 131 -- 99 % -- --  12/10/23 1937 123/61 -- -- (!) 120 -- 99 % -- --  12/10/23 1915 (!) 164/98 -- -- -- -- 98 % -- --  12/10/23 1912 (!) 164/98 -- -- 85 -- -- -- --  12/10/23 1900 (!) 163/97 -- -- 84 -- -- -- --  12/10/23 1845 (!) 161/99 -- -- 42 -- -- -- --  12/10/23 1830 (!) 157/95 -- -- 88 -- -- -- --  12/10/23 1815 (!) 150/97 -- -- 82 -- -- -- --  12/10/23 1800 (!) 154/98 -- -- 91 -- -- -- --  12/10/23 1749 (!) 164/101 -- -- 90 -- -- -- --  12/10/23 1740 (!) 153/97 -- -- 93 -- -- -- --  12/10/23 1723 (!) 156/107 98.3 F (36.8 C) Oral 86 16 99 % 4\' 9"  (1.448 m) 78.8 kg    Physical Exam  MAU Course  Procedures  MDM CCUA CBC CMP P/C Ratio Serial BP's  Headache cocktail: (LR 1000 mL @ 999 mL/hr with Benadryl 25 mg IVP, Compazine 10 mg IVP)   Results for orders placed or performed during the hospital encounter of 12/10/23 (from the past 24 hours)  CBC     Status: Abnormal   Collection Time: 12/10/23  5:38 PM  Result Value Ref Range   WBC 7.5 4.0 - 10.5 K/uL   RBC 3.84 (L) 3.87 - 5.11 MIL/uL   Hemoglobin 10.6 (L) 12.0 - 15.0 g/dL   HCT 78.2 (L) 95.6 - 21.3 %   MCV 86.2 80.0 - 100.0 fL   MCH 27.6 26.0 - 34.0 pg   MCHC 32.0 30.0 - 36.0 g/dL   RDW 08.6 (H) 57.8 - 46.9 %   Platelets 248 150 - 400 K/uL   nRBC 0.3 (H) 0.0 - 0.2 %  Comprehensive metabolic panel     Status: Abnormal   Collection Time: 12/10/23  5:38 PM  Result Value Ref Range   Sodium 135 135 - 145 mmol/L  Potassium 3.8 3.5 - 5.1 mmol/L   Chloride 105 98 - 111 mmol/L   CO2 21 (L) 22 - 32 mmol/L   Glucose, Bld 57 (L) 70 - 99 mg/dL   BUN 6 6 - 20 mg/dL   Creatinine, Ser 6.21 0.44 - 1.00 mg/dL   Calcium 8.6 (L) 8.9 - 10.3 mg/dL   Total Protein 6.5 6.5 - 8.1 g/dL    Albumin 2.6 (L) 3.5 - 5.0 g/dL   AST 21 15 - 41 U/L   ALT 9 0 - 44 U/L   Alkaline Phosphatase 120 38 - 126 U/L   Total Bilirubin <0.2 <1.2 mg/dL   GFR, Estimated >30 >86 mL/min   Anion gap 9 5 - 15  Protein / creatinine ratio, urine     Status: Abnormal   Collection Time: 12/10/23  5:55 PM  Result Value Ref Range   Creatinine, Urine 56 mg/dL   Total Protein, Urine 182 mg/dL   Protein Creatinine Ratio 3.25 (H) 0.00 - 0.15 mg/mg[Cre]     *Consult with Dr. Vergie Living @ [phone] - notified of patient's complaints, assessments, & lab results, tx plan admit to L&D for IOL for PEC w/SF and MgSO4 infusion - agrees with plan   Assessment and Plan  1. Pre-eclampsia, severe, antepartum, third trimester (Primary) - Admit to L&D for IOL - TC to Derrel Nip, MD to notify of patient's admission for IOL and MgSO4 infusion -- will enter orders - See H&P documentation of labor team  2. [redacted] weeks gestation of pregnancy    Raelyn Mora, CNM 12/10/2023, 7:00 PM

## 2023-12-10 NOTE — Telephone Encounter (Signed)
Preadmission screen  

## 2023-12-11 ENCOUNTER — Ambulatory Visit: Payer: Medicaid Other

## 2023-12-11 ENCOUNTER — Encounter (HOSPITAL_COMMUNITY): Payer: Self-pay | Admitting: Obstetrics and Gynecology

## 2023-12-11 ENCOUNTER — Other Ambulatory Visit: Payer: Medicaid Other

## 2023-12-11 ENCOUNTER — Inpatient Hospital Stay (HOSPITAL_COMMUNITY): Payer: Medicaid Other | Admitting: Anesthesiology

## 2023-12-11 DIAGNOSIS — O99284 Endocrine, nutritional and metabolic diseases complicating childbirth: Secondary | ICD-10-CM

## 2023-12-11 DIAGNOSIS — Z3A35 35 weeks gestation of pregnancy: Secondary | ICD-10-CM

## 2023-12-11 DIAGNOSIS — Z3490 Encounter for supervision of normal pregnancy, unspecified, unspecified trimester: Secondary | ICD-10-CM | POA: Diagnosis present

## 2023-12-11 DIAGNOSIS — O365930 Maternal care for other known or suspected poor fetal growth, third trimester, not applicable or unspecified: Secondary | ICD-10-CM

## 2023-12-11 DIAGNOSIS — O1414 Severe pre-eclampsia complicating childbirth: Secondary | ICD-10-CM

## 2023-12-11 LAB — CBC
HCT: 32.9 % — ABNORMAL LOW (ref 36.0–46.0)
HCT: 30.8 % — ABNORMAL LOW (ref 36.0–46.0)
Hemoglobin: 10.7 g/dL — ABNORMAL LOW (ref 12.0–15.0)
Hemoglobin: 10.1 g/dL — ABNORMAL LOW (ref 12.0–15.0)
MCH: 28.1 pg (ref 26.0–34.0)
MCH: 28 pg (ref 26.0–34.0)
MCHC: 32.5 g/dL (ref 30.0–36.0)
MCHC: 32.8 g/dL (ref 30.0–36.0)
MCV: 86.4 fL (ref 80.0–100.0)
MCV: 85.3 fL (ref 80.0–100.0)
Platelets: 265 10*3/uL (ref 150–400)
Platelets: 257 10*3/uL (ref 150–400)
RBC: 3.81 MIL/uL — ABNORMAL LOW (ref 3.87–5.11)
RBC: 3.61 MIL/uL — ABNORMAL LOW (ref 3.87–5.11)
RDW: 15.9 % — ABNORMAL HIGH (ref 11.5–15.5)
RDW: 15.7 % — ABNORMAL HIGH (ref 11.5–15.5)
WBC: 9 10*3/uL (ref 4.0–10.5)
WBC: 12.2 10*3/uL — ABNORMAL HIGH (ref 4.0–10.5)
nRBC: 0 % (ref 0.0–0.2)
nRBC: 0 % (ref 0.0–0.2)

## 2023-12-11 LAB — RPR: RPR Ser Ql: NONREACTIVE

## 2023-12-11 MED ORDER — IBUPROFEN 600 MG PO TABS
600.0000 mg | ORAL_TABLET | Freq: Four times a day (QID) | ORAL | Status: DC
  Administered 2023-12-11 – 2023-12-14 (×11): 600 mg via ORAL
  Filled 2023-12-11 (×11): qty 1

## 2023-12-11 MED ORDER — DIPHENHYDRAMINE HCL 50 MG/ML IJ SOLN: 12.5000 mg | INTRAMUSCULAR | Status: DC | PRN

## 2023-12-11 MED ORDER — FENTANYL-BUPIVACAINE-NACL 0.5-0.125-0.9 MG/250ML-% EP SOLN
12.0000 mL/h | EPIDURAL | Status: DC | PRN
  Administered 2023-12-11: 12 mL/h via EPIDURAL
  Filled 2023-12-11: qty 250

## 2023-12-11 MED ORDER — PHENYLEPHRINE 80 MCG/ML (10ML) SYRINGE FOR IV PUSH (FOR BLOOD PRESSURE SUPPORT)
80.0000 ug | PREFILLED_SYRINGE | INTRAVENOUS | Status: DC | PRN
Start: 2023-12-11 — End: 2023-12-11

## 2023-12-11 MED ORDER — LACTATED RINGERS IV SOLN
500.0000 mL | Freq: Once | INTRAVENOUS | Status: AC
Start: 2023-12-11 — End: 2023-12-11
  Administered 2023-12-11: 500 mL via INTRAVENOUS

## 2023-12-11 MED ORDER — WITCH HAZEL-GLYCERIN EX PADS: 1.0000 | MEDICATED_PAD | CUTANEOUS | Status: DC | PRN

## 2023-12-11 MED ORDER — SIMETHICONE 80 MG PO CHEW: 80.0000 mg | CHEWABLE_TABLET | ORAL | Status: DC | PRN

## 2023-12-11 MED ORDER — EPHEDRINE 5 MG/ML INJ
10.0000 mg | INTRAVENOUS | Status: DC | PRN
  Filled 2023-12-11: qty 5

## 2023-12-11 MED ORDER — SODIUM CHLORIDE 0.9 % IV SOLN
5.0000 10*6.[IU] | Freq: Once | INTRAVENOUS | Status: AC
  Administered 2023-12-11: 5 10*6.[IU] via INTRAVENOUS
  Filled 2023-12-11: qty 5

## 2023-12-11 MED ORDER — FENTANYL CITRATE (PF) 100 MCG/2ML IJ SOLN
100.0000 ug | INTRAMUSCULAR | Status: DC | PRN
  Administered 2023-12-11 (×4): 100 ug via INTRAVENOUS
  Filled 2023-12-11 (×3): qty 2

## 2023-12-11 MED ORDER — PRENATAL MULTIVITAMIN CH
1.0000 | ORAL_TABLET | Freq: Every day | ORAL | Status: DC
  Administered 2023-12-12 – 2023-12-13 (×2): 1 via ORAL
  Filled 2023-12-11 (×2): qty 1

## 2023-12-11 MED ORDER — SENNOSIDES-DOCUSATE SODIUM 8.6-50 MG PO TABS
2.0000 | ORAL_TABLET | ORAL | Status: DC
  Administered 2023-12-12 – 2023-12-14 (×3): 2 via ORAL
  Filled 2023-12-11 (×3): qty 2

## 2023-12-11 MED ORDER — ONDANSETRON HCL 4 MG PO TABS
4.0000 mg | ORAL_TABLET | ORAL | Status: DC | PRN
Start: 2023-12-11 — End: 2023-12-14
  Administered 2023-12-12: 4 mg via ORAL
  Filled 2023-12-11: qty 1

## 2023-12-11 MED ORDER — LIDOCAINE-EPINEPHRINE (PF) 1.5 %-1:200000 IJ SOLN
INTRAMUSCULAR | Status: DC | PRN
  Administered 2023-12-11: 5 mL via EPIDURAL

## 2023-12-11 MED ORDER — LACTATED RINGERS IV SOLN: INTRAVENOUS | Status: DC

## 2023-12-11 MED ORDER — PHENYLEPHRINE 80 MCG/ML (10ML) SYRINGE FOR IV PUSH (FOR BLOOD PRESSURE SUPPORT): 80.0000 ug | PREFILLED_SYRINGE | INTRAVENOUS | Status: DC | PRN

## 2023-12-11 MED ORDER — EPHEDRINE 5 MG/ML INJ
10.0000 mg | INTRAVENOUS | Status: DC | PRN
  Administered 2023-12-11: 10 mg via INTRAVENOUS

## 2023-12-11 MED ORDER — ACETAMINOPHEN 325 MG PO TABS
650.0000 mg | ORAL_TABLET | ORAL | Status: DC | PRN
Start: 2023-12-11 — End: 2023-12-14
  Administered 2023-12-12 – 2023-12-14 (×3): 650 mg via ORAL
  Filled 2023-12-11 (×3): qty 2

## 2023-12-11 MED ORDER — BENZOCAINE-MENTHOL 20-0.5 % EX AERO: 1.0000 | INHALATION_SPRAY | CUTANEOUS | Status: DC | PRN

## 2023-12-11 MED ORDER — ZOLPIDEM TARTRATE 5 MG PO TABS: 5.0000 mg | ORAL_TABLET | Freq: Every evening | ORAL | Status: DC | PRN

## 2023-12-11 MED ORDER — ONDANSETRON HCL 4 MG/2ML IJ SOLN
4.0000 mg | INTRAMUSCULAR | Status: DC | PRN
Start: 2023-12-11 — End: 2023-12-14
  Administered 2023-12-11: 4 mg via INTRAVENOUS
  Filled 2023-12-11: qty 2

## 2023-12-11 MED ORDER — PENICILLIN G POT IN DEXTROSE 60000 UNIT/ML IV SOLN
3.0000 10*6.[IU] | INTRAVENOUS | Status: DC
  Administered 2023-12-11 (×2): 3 10*6.[IU] via INTRAVENOUS
  Filled 2023-12-11 (×6): qty 50

## 2023-12-11 MED ORDER — TETANUS-DIPHTH-ACELL PERTUSSIS 5-2.5-18.5 LF-MCG/0.5 IM SUSY: 0.5000 mL | PREFILLED_SYRINGE | Freq: Once | INTRAMUSCULAR | Status: DC

## 2023-12-11 MED ORDER — FUROSEMIDE 20 MG PO TABS
20.0000 mg | ORAL_TABLET | Freq: Every day | ORAL | Status: DC
  Administered 2023-12-12 – 2023-12-14 (×3): 20 mg via ORAL
  Filled 2023-12-11 (×3): qty 1

## 2023-12-11 MED ORDER — COCONUT OIL OIL
1.0000 | TOPICAL_OIL | Status: DC | PRN
  Administered 2023-12-11: 1 via TOPICAL

## 2023-12-11 MED ORDER — DIBUCAINE (PERIANAL) 1 % EX OINT: 1.0000 | TOPICAL_OINTMENT | CUTANEOUS | Status: DC | PRN

## 2023-12-11 MED ORDER — DIPHENHYDRAMINE HCL 25 MG PO CAPS: 25.0000 mg | ORAL_CAPSULE | Freq: Four times a day (QID) | ORAL | Status: DC | PRN

## 2023-12-11 NOTE — Discharge Summary (Signed)
Postpartum Discharge Summary       Patient Name: Shannon [name] DOB: 24-Jul-2006 MRN: [id number]  Date of admission: 12/10/2023 Delivery date:12/11/2023 Delivering provider: Celedonio Savage Date of discharge: 12/14/2023  Admitting diagnosis: Fetal growth restriction antepartum [O36.5990] Encounter for induction of labor [Z34.90] Intrauterine pregnancy: [redacted]w[redacted]d     Secondary diagnosis:  Principal Problem:   Fetal growth restriction antepartum Active Problems:   Mild persistent asthma   Hx of preeclampsia, prior pregnancy, currently pregnant   History of delivery by vacuum extraction, currently pregnant   Hypothyroidism   Encounter for induction of labor  Additional problems:     Discharge diagnosis: Preterm Pregnancy Delivered and Preeclampsia (severe)                                              Post partum procedures: Augmentation: AROM, Pitocin, and IP Foley Complications: None  Hospital course: Induction of Labor With Vaginal Delivery   17 y.o. yo [id number] at [redacted]w[redacted]d was admitted to the hospital 12/10/2023 for induction of labor.  Indication for induction: Preeclampsia.  Patient had an labor course complicated bysevere pre eclampsia requiring magnesium.  Membrane Rupture Time/Date: 11:08 AM,12/11/2023  Delivery Method:Vaginal, Spontaneous Operative Delivery:N/A Episiotomy: None Lacerations:  None Details of delivery can be found in separate delivery note.  Patient had a postpartum course complicated by none. Patient is discharged home 12/14/23.  Newborn Data: Birth date:12/11/2023 Birth time:4:01 PM Gender:Female Living status:Living Apgars:9 ,9  Weight:1860 g  Magnesium Sulfate received: Yes: Seizure prophylaxis BMZ received: No Rhophylac:N/A MMR:No T-DaP: na Flu: No RSV Vaccine received: No Transfusion:No  Immunizations received: Immunization History  Administered Date(s) Administered   Influenza,inj,Quad PF,6+ Mos 11/09/2020    Physical exam   Vitals:   12/13/23 1957 12/13/23 2352 12/14/23 0422 12/14/23 0810  BP: (!) 148/92 134/83 138/88 (!) 139/90  Pulse: 92 (!) 106 (!) 109 (!) 109  Resp: 20 19 17 16   Temp: 98.7 F (37.1 C) 98.3 F (36.8 C) 98.4 F (36.9 C) 98.4 F (36.9 C)  TempSrc: Oral Oral Tympanic Oral  SpO2: 97% 98% 99% 100%  Weight:      Height:       General: alert, cooperative, and no distress Lochia: appropriate Uterine Fundus: firm Incision:  DVT Evaluation: No evidence of DVT seen on physical exam. Labs: Lab Results  Component Value Date   WBC 12.2 (H) 12/11/2023   HGB 10.1 (L) 12/11/2023   HCT 30.8 (L) 12/11/2023   MCV 85.3 12/11/2023   PLT 257 12/11/2023      Latest Ref Rng & Units 12/10/2023    5:38 PM  CMP  Glucose 70 - 99 mg/dL 57   BUN 6 - 20 mg/dL 6   Creatinine 1.47 - 8.29 mg/dL 5.62   Sodium 130 - 865 mmol/L 135   Potassium 3.5 - 5.1 mmol/L 3.8   Chloride 98 - 111 mmol/L 105   CO2 22 - 32 mmol/L 21   Calcium 8.9 - 10.3 mg/dL 8.6   Total Protein 6.5 - 8.1 g/dL 6.5   Total Bilirubin <7.8 mg/dL <4.6   Alkaline Phos 38 - 126 U/L 120   AST 15 - 41 U/L 21   ALT 0 - 44 U/L 9    Edinburgh Score:    11/07/2020    2:40 PM  Edinburgh Postnatal Depression Scale Screening Tool  I have been able to laugh and see the funny side of things. 0  I have looked forward with enjoyment to things. 1  I have blamed myself unnecessarily when things went wrong. 2  I have been anxious or worried for no good reason. 2  I have felt scared or panicky for no good reason. 1  Things have been getting on top of me. 1  I have been so unhappy that I have had difficulty sleeping. 0  I have felt sad or miserable. 1  I have been so unhappy that I have been crying. 1  The thought of harming myself has occurred to me. 0  Edinburgh Postnatal Depression Scale Total 9   No data recorded  After visit meds:  Allergies as of 12/14/2023   No Active Allergies      Medication List     STOP taking these  medications    aspirin 81 MG chewable tablet   labetalol 100 MG tablet Commonly known as: NORMODYNE       TAKE these medications    cyclobenzaprine 5 MG tablet Commonly known as: FLEXERIL Take 5 mg by mouth 3 (three) times daily as needed for muscle spasms.   furosemide 20 MG tablet Commonly known as: LASIX Take 1 tablet (20 mg total) by mouth daily.   HYDROcodone-acetaminophen 5-325 MG tablet Commonly known as: NORCO/VICODIN Take 1 tablet by mouth every 6 (six) hours as needed for moderate pain (pain score 4-6).   ibuprofen 600 MG tablet Commonly known as: ADVIL Take 1 tablet (600 mg total) by mouth every 6 (six) hours.   levothyroxine 50 MCG tablet Commonly known as: SYNTHROID Take 50 mcg by mouth daily.   NIFEdipine 30 MG 24 hr tablet Commonly known as: ADALAT CC Take 1 tablet (30 mg total) by mouth daily.   ondansetron 4 MG tablet Commonly known as: Zofran Take 1 tablet (4 mg total) by mouth every 8 (eight) hours as needed for nausea or vomiting.   PRENATAL VITAMIN PO Take 1 tablet by mouth daily.   promethazine 25 MG tablet Commonly known as: PHENERGAN Take 1 tablet (25 mg total) by mouth every 6 (six) hours as needed for nausea or vomiting.   promethazine 25 MG tablet Commonly known as: PHENERGAN Take 1 tablet (25 mg total) by mouth every 6 (six) hours as needed for nausea or vomiting.   venlafaxine XR 37.5 MG 24 hr capsule Commonly known as: EFFEXOR-XR Take 37.5 mg by mouth daily with breakfast.         Discharge home in stable condition Infant Feeding: Breast Infant Disposition:NICU Discharge instruction: per After Visit Summary and Postpartum booklet. Activity: Advance as tolerated. Pelvic rest for 6 weeks.  Diet: routine diet Future Appointments: Future Appointments  Date Time Provider Department Center  12/16/2023  6:30 AM [facility] SCHED ROOM [facility] None  12/19/2023  9:00 AM [facility] NURSE [facility] None  01/28/2024  1:10 PM Adam Phenix,  MD [facility] None   Follow up Visit:  Follow-up Information     [facility] Follow up on 12/22/2023.   Specialty: Obstetrics and Gynecology Why: MyChart Connect visit: BP check Contact information: [address] [place] [address] [phone]                 Please schedule this patient for a In person postpartum visit in 4 weeks with the following provider: Any provider. Additional Postpartum F/U:BP check 1 week  High risk pregnancy complicated by: HTN Delivery mode:  Vaginal, Spontaneous Anticipated Birth Control:  Unsure   12/14/2023 Lazaro Arms, MD

## 2023-12-11 NOTE — Progress Notes (Signed)
NICU charge nurse notified of baby's weight. Dr Virginia Rochester contacted, and will come see baby in Labor room shortly

## 2023-12-11 NOTE — Lactation Note (Signed)
This note was copied from a baby's chart.  NICU Lactation Consultation Note  Patient Name: Shannon [name] Date: 12/11/2023 Age:19 hours  Reason for consult: Initial assessment; NICU baby; Infant < 6lbs; Other (Comment); 1st time breastfeeding (teen pregnancy, gHTN, IUGR)  SUBJECTIVE Visited with family of 3 hours old LPI NICU female; baby "Shannon [name]" got admitted to the NICU due to prematurity and SGA. Shannon [name] is a P2 but this is her first time breastfeeding. Assisted with breast massage, hand expression and fit her with a pumping band in size L for hands on pumping, she was already pumping when entered the room, praised her for her efforts. Reviewed pumping schedule, pump setting, pumping log, lactogenesis II/III and anticipatory guidelines. Her plan is to do both, direct breastfeeding along with pumping and bottle feeding; he's already PO feeding donor milk via bottle.  OBJECTIVE Infant data: Mother's Current Feeding Choice: Breast Milk and Donor Milk  O2 Device: Room Air  Infant feeding assessment IDFTS - Readiness: 1 IDFTS - Quality: 3   Maternal data: G2P1102 Vaginal, Spontaneous Has patient been taught Hand Expression?: Yes Hand Expression Comments: no colostrum noted yet Significant Breast History:: moderate breast changes during the pregnancy Current breast feeding challenges:: NICU admission Does the patient have breastfeeding experience prior to this delivery?: No Pumping frequency: LPI in NICU Pumped volume: 0 mL Flange Size: 21 Hands-free pumping top sizes: Large [name]) Risk factor for low/delayed milk supply:: prematurity, infant separation, IUGR, < 6 lbs  WIC Program: Yes WIC Referral Sent?: Yes What county?: [place]  ASSESSMENT Infant: Feeding Status: Scheduled 9-12-3-6 Feeding method: Bottle Nipple Type: Nfant Extra Slow Flow (gold)  Maternal: Milk volume: Normal  INTERVENTIONS/PLAN Interventions: Interventions: Breast feeding basics  reviewed; Breast massage; Hand express; Coconut oil; DEBP; Education; [employer] brochure; NICU Pumping Log; CDC Guidelines for Breast Pump Cleaning Tools: Pump; Flanges; Coconut oil; Hands-free pumping top Pump Education: Setup, frequency, and cleaning; Milk Storage  Plan: Encouraged pumping every 3 hours, ideally 8 pumping sessions/24 hours Breast massage, hand expression and coconut oil were also encouraged prior pumping She'll call for assistance when Shannon [name] is ready to go to breast Parents will continue advancing on bottle feedings  FOB and MGM present and supportive. All questions and concerns answered, family to contact [facility].  Consult Status: NICU follow-up NICU Follow-up type: New admission follow up   Shannon [name] 12/11/2023, 7:13 PM

## 2023-12-11 NOTE — Anesthesia Preprocedure Evaluation (Addendum)
Anesthesia Evaluation  Patient identified by MRN, date of birth, ID band Patient awake    Reviewed: Allergy & Precautions, NPO status , Patient's Chart, lab work & pertinent test results  Airway Mallampati: II  TM Distance: >3 FB Neck ROM: Full    Dental no notable dental hx.    Pulmonary asthma    Pulmonary exam normal        Cardiovascular hypertension, Pt. on medications and Pt. on home beta blockers  Rhythm:Regular Rate:Normal     Neuro/Psych negative neurological ROS     GI/Hepatic negative GI ROS, Neg liver ROS,,,  Endo/Other  Hypothyroidism    Renal/GU   negative genitourinary   Musculoskeletal   Abdominal Normal abdominal exam  (+)   Peds  Hematology Lab Results      Component                Value               Date                      WBC                      9.0                 12/11/2023                HGB                      10.7 (L)            12/11/2023                HCT                      32.9 (L)            12/11/2023                MCV                      86.4                12/11/2023                PLT                      265                 12/11/2023              Anesthesia Other Findings   Reproductive/Obstetrics                             Anesthesia Physical Anesthesia Plan  ASA: 2  Anesthesia Plan: Epidural   Post-op Pain Management:    Induction:   PONV Risk Score and Plan: 2 and Treatment may vary due to age or medical condition  Airway Management Planned: Natural Airway  Additional Equipment: None  Intra-op Plan:   Post-operative Plan:   Informed Consent: I have reviewed the patients History and Physical, chart, labs and discussed the procedure including the risks, benefits and alternatives for the proposed anesthesia with the patient or authorized representative who has indicated his/her understanding and acceptance.     Dental  advisory given  Plan Discussed with:   Anesthesia  Plan Comments:        Anesthesia Quick Evaluation

## 2023-12-11 NOTE — Progress Notes (Signed)
Labor Progress Note Shannon [name] is a 17 y.o. G2P1001 at [redacted]w[redacted]d presented for IOL due to preeclampsia   S: Mild cramping. No complaints. Support person at bedside  O:  BP 116/66   Pulse 96   Temp 98.2 F (36.8 C) (Oral)   Resp 17   Ht 4\' 9"  (1.448 m)   Wt 78.8 kg   LMP 04/06/2023 (Exact Date) Comment: pt reports normal for her to have irregular periods  SpO2 99%   BMI 37.59 kg/m  EFM: 125bpm/mod variability/+ accels/no decels  CVE: Dilation: 3.5 Effacement (%): 80 Station: -2 Presentation: Vertex Exam by:: Brandy Hale, RN   A&P: 17 y.o. G2P1001 [redacted]w[redacted]d here for IOL due to pre-e w SF (BPs; sx; ^P/C ratio); also w dx of severe FGR with nl dopplers with previous plan for IOL @ 36wks  #Labor: Progressing well. FB out at 0620. Start pitocin 2x2 protocol and GBS prophylaxis with PCN now. BMZ second dose due at 2024. Anticipate preterm vaginal delivery #Pain: Per pt request #FWB: Cat 1 #GBS  pending. Will start PCN now  #Gestational HTN>Preeclampsia: Continue labetalol 200 BID. Continue Mag.    #Severe IUGR: [place] on 12/16 indicated EFW of 4lbs, 4oz measuring at 2nd percentile.   #Hypothyroidism: Continue home synthroid.  Gwenlyn Perking, MD 7:15 AM

## 2023-12-11 NOTE — Anesthesia Procedure Notes (Signed)
Epidural Patient location during procedure: OB Start time: 12/11/2023 10:10 AM End time: 12/11/2023 10:25 AM  Staffing Anesthesiologist: Atilano Median, DO Performed: anesthesiologist   Preanesthetic Checklist Completed: patient identified, IV checked, site marked, risks and benefits discussed, surgical consent, monitors and equipment checked, pre-op evaluation and timeout performed  Epidural Patient position: sitting Prep: ChloraPrep Patient monitoring: heart rate, continuous pulse ox and blood pressure Approach: midline Location: L3-L4 Injection technique: LOR saline  Needle:  Needle type: Tuohy  Needle gauge: 17 G Needle length: 9 cm Needle insertion depth: 9 cm Catheter type: closed end flexible Catheter size: 20 Guage Catheter at skin depth: 14 cm Test dose: negative and 1.5% lidocaine with Epi 1:200 K  Assessment Events: blood not aspirated, no cerebrospinal fluid, injection not painful, no injection resistance and no paresthesia  Additional Notes Patient identified. Risks/Benefits/Options discussed with patient including but not limited to bleeding, infection, nerve damage, paralysis, failed block, incomplete pain control, headache, blood pressure changes, nausea, vomiting, reactions to medications, itching and postpartum back pain. Confirmed with bedside nurse the patient's most recent platelet count. Confirmed with patient that they are not currently taking any anticoagulation, have any bleeding history or any family history of bleeding disorders. Patient expressed understanding and wished to proceed. All questions were answered. Sterile technique was used throughout the entire procedure. Please see nursing notes for vital signs. Test dose was given through epidural catheter and negative prior to continuing to dose epidural or start infusion. Warning signs of high block given to the patient including shortness of breath, tingling/numbness in hands, complete motor  block, or any concerning symptoms with instructions to call for help. Patient was given instructions on fall risk and not to get out of bed. All questions and concerns addressed with instructions to call with any issues or inadequate analgesia.    Reason for block:procedure for pain

## 2023-12-11 NOTE — Progress Notes (Signed)
Labor Progress Note Shannon [name] is a 17 y.o. G2P1001 at [redacted]w[redacted]d presented for IOL due to preeclampsia   S: Comfortable w epidural, no concerns at this time.  O:  BP (!) 106/52   Pulse 93   Temp 97.9 F (36.6 C) (Oral)   Resp 16   Ht 4\' 9"  (1.448 m)   Wt 78.8 kg   LMP 04/06/2023 (Exact Date) Comment: pt reports normal for her to have irregular periods  SpO2 99%   BMI 37.59 kg/m  EFM: 120/mod/+a/-d  CVE: Dilation: 4 Effacement (%): 50 Station: -2 Presentation: Vertex Exam by:: Dr Lucianne Muss   A&P: 17 y.o. G2P1001 [redacted]w[redacted]d here for IOL due to pre-e w SF (BPs; sx; ^P/C ratio); also w dx of severe FGR with nl dopplers with previous plan for IOL @ 36wks  #Labor: AROM discussed with patient and she verbally consented  AROM performed with small amount of clear fluid  cont to uptitrate pit #Pain: Epidural #FWB: Cat 1 #GBS unknown/pending  PCN given GA  #Gestational HTN>Preeclampsia: Continue labetalol 200 BID. Continue Mag.    #Severe IUGR: [place] on 12/16 indicated EFW of 4lbs, 4oz measuring at 2nd percentile.   #Hypothyroidism: Continue home synthroid.  Sundra Aland, MD 11:24 AM

## 2023-12-11 NOTE — Progress Notes (Signed)
Labor Progress Note Shannon [name] is a 17 y.o. G2P1001 at [redacted]w[redacted]d presented for IOL due to preeclampsia   S: Ctx more uncomfortable, requesting epidural, no sxs pre-e.  O:  BP 111/64   Pulse 90   Temp 97.9 F (36.6 C) (Oral)   Resp 16   Ht 4\' 9"  (1.448 m)   Wt 78.8 kg   LMP 04/06/2023 (Exact Date) Comment: pt reports normal for her to have irregular periods  SpO2 99%   BMI 37.59 kg/m  EFM: 120/mod/+10x10 accels/no decels  CVE: Dilation: 3.5 Effacement (%): 80 Station: -2 Presentation: Vertex Exam by:: Brandy Hale, RN   A&P: 17 y.o. G2P1001 [redacted]w[redacted]d here for IOL due to pre-e w SF (BPs; sx; ^P/C ratio); also w dx of severe FGR with nl dopplers with previous plan for IOL @ 36wks  #Labor: Progressing well. Discussed plan to AROM for next step in IOL -- will wait until pt has epidural to do so. #Pain: Epidural #FWB: Cat 1 #GBS unknown/pending  PCN given GA  #Gestational HTN>Preeclampsia: Continue labetalol 200 BID. Continue Mag.    #Severe IUGR: [place] on 12/16 indicated EFW of 4lbs, 4oz measuring at 2nd percentile.   #Hypothyroidism: Continue home synthroid.  Sundra Aland, MD 9:53 AM

## 2023-12-12 LAB — CULTURE, BETA STREP (GROUP B ONLY)

## 2023-12-12 MED ORDER — LABETALOL HCL 5 MG/ML IV SOLN
INTRAVENOUS | Status: AC
  Filled 2023-12-12: qty 4

## 2023-12-12 MED ORDER — AMLODIPINE BESYLATE 5 MG PO TABS
5.0000 mg | ORAL_TABLET | Freq: Every day | ORAL | Status: DC
  Administered 2023-12-12: 5 mg via ORAL
  Filled 2023-12-12: qty 1

## 2023-12-12 MED ORDER — LABETALOL HCL 5 MG/ML IV SOLN
20.0000 mg | Freq: Once | INTRAVENOUS | Status: AC
Start: 2023-12-12 — End: 2023-12-12
  Administered 2023-12-12: 20 mg via INTRAVENOUS

## 2023-12-12 MED ORDER — AMLODIPINE BESYLATE 5 MG PO TABS
10.0000 mg | ORAL_TABLET | Freq: Every day | ORAL | Status: DC
  Administered 2023-12-13: 10 mg via ORAL
  Filled 2023-12-12: qty 2

## 2023-12-12 MED ORDER — VENLAFAXINE HCL ER 37.5 MG PO CP24
37.5000 mg | ORAL_CAPSULE | Freq: Every day | ORAL | Status: DC
  Administered 2023-12-13 – 2023-12-14 (×2): 37.5 mg via ORAL
  Filled 2023-12-12 (×2): qty 1

## 2023-12-12 NOTE — Social Work (Addendum)
Patient screened out for psychosocial assessment since none of the following apply: Psychosocial stressors documented in mother or baby's chart Gestation less than 32 weeks Code at delivery  Infant with anomalies  It is noted that MOB has a history of depression/anxiety. MOB's symptoms currently being treated with medication. Per chart review, it is noted that MOB has an active prescription for Effexor XR 37.5mg  for anxiety and depression symptoms.  Please contact the Clinical Social Worker if needs arise, by [facility] request, or if MOB scores greater than 9/yes to question 10 on Edinburgh Postpartum Depression Screen. Per chart review, MOB scored a 9 on the [place].   Vivi Barrack, MSW, LCSW Clinical Social Worker  [phone] 08-25-23  10:27 AM

## 2023-12-12 NOTE — Anesthesia Postprocedure Evaluation (Signed)
Anesthesia Post Note  Patient: Ahriana E Sandeen  Procedure(s) Performed: AN AD HOC LABOR EPIDURAL     Patient location during evaluation: Mother Baby Anesthesia Type: Epidural Level of consciousness: awake and alert and oriented Pain management: satisfactory to patient Vital Signs Assessment: post-procedure vital signs reviewed and stable Respiratory status: respiratory function stable Cardiovascular status: stable Postop Assessment: no headache, no backache, epidural receding, patient able to bend at knees, no signs of nausea or vomiting, adequate PO intake and able to ambulate Anesthetic complications: no   No notable events documented.  Last Vitals:  Vitals:   12/12/23 0600 12/12/23 0700  BP:    Pulse:    Resp: 16 16  Temp:    SpO2:      Last Pain:  Vitals:   12/12/23 0318  TempSrc: Oral  PainSc:    Pain Goal:                   Karleen Dolphin

## 2023-12-12 NOTE — Lactation Note (Signed)
This note was copied from a baby's chart.  NICU Lactation Consultation Note  Patient Name: Shannon [name] [id number] Date: 12/12/2023 Age:19 hours  Reason for consult: Follow-up assessment; NICU baby; Primapara; 1st time breastfeeding; Late-preterm 34-36.6wks; Infant < 6lbs; Other (Comment) (39 yr old, GHTN on MgSO4)  SUBJECTIVE  LC in to visit with P2 Mom of LPT baby "Shannon [name]" in the NICU.   Mom was pumping when LC entered the room.  Mom shared her concern about not expressing any more colostrum since the first time.  Reassured her and reviewed normal expectations.  Mom also encouraged to increase the pump strength, as she hadn't touched the dial yet.  LC increased the strength to "4 drops" without any discomfort felt.  Mom encouraged to turn it down if it becomes uncomfortable.   At the end of pumping, LC assisted Mom to see the colostrum drops noted in the flange.  Mom was thrilled.  Drop placed on Mom's nipple.  Encouraged pumping in the NICU and colostrum drops can be placed at baby's mouth if he is cueing. Mom to ask for breast milk labels from baby's nurse. Reviewed cleaning process and LC cleaned the flanges and placed on paper towel over cloth in bin.  OBJECTIVE Infant data: Mother's Current Feeding Choice: Breast Milk and Donor Milk  O2 Device: Room Air  Infant feeding assessment IDFTS - Readiness: 4 IDFTS - Quality: 4 (no latch/suck, gagging, PO stopped)   Maternal data: G2P1102 Vaginal, Spontaneous Has patient been taught Hand Expression?: Yes Hand Expression Comments: no colostrum noted yet Significant Breast History:: moderate breast changes during the pregnancy Current breast feeding challenges:: NICU admission Does the patient have breastfeeding experience prior to this delivery?: No Pumping frequency: 8 times per 24 hrs Pumped volume: 0 mL (drops) Flange Size: 21 Hands-free pumping top sizes: Large [name]) Risk factor for low/delayed milk supply::  prematurity, infant separation, IUGR, < 6 lbs  WIC Program: Yes WIC Referral Sent?: Yes What county?: [place] Pump:  (Mom informed of WIC loaner available)  ASSESSMENT Infant:  Feeding Status: Scheduled 9-12-3-6 Feeding method: Tube/Gavage (Bolus) Nipple Type: Nfant Extra Slow Flow (gold)  Maternal: Milk volume: Normal  INTERVENTIONS/PLAN Interventions: Interventions: Breast feeding basics reviewed; Skin to skin; Breast massage; Hand express; DEBP; Education Tools: Pump; Flanges; Hands-free pumping top Pump Education: Setup, frequency, and cleaning; Milk Storage  Plan: 1-STS with baby when able to 2- massage breasts and hand express drops  3-Pump both breasts on initiation setting 4- ask for LC prn, WIC loaner on discharge prn Consult Status: NICU follow-up NICU Follow-up type: Verify DEBP issuance; Maternal D/C visit; Verify onset of copious milk; Verify absence of engorgement   Shannon [name] 12/12/2023, 12:26 PM

## 2023-12-12 NOTE — Progress Notes (Signed)
Post Partum Day 1 Subjective: no complaints, voiding, tolerating PO, and feels groggy on magnesium   Objective: Blood pressure (!) 102/50, pulse 77, temperature 98.1 F (36.7 C), temperature source Oral, resp. rate 16, height 4\' 9"  (1.448 m), weight 78.8 kg, last menstrual period 04/06/2023, SpO2 99%, unknown if currently breastfeeding.  Physical Exam:  General: cooperative, fatigued, and no distress Lochia: appropriate Uterine Fundus: firm DVT Evaluation: No evidence of DVT seen on physical exam.  Recent Labs    12/11/23 0734 12/11/23 1633  HGB 10.7* 10.1*  HCT 32.9* 30.8*    Assessment/Plan: Discontinue magnesium 24 hr PP   LOS: 2 days   Scheryl Darter, MD 12/12/2023, 9:31 AM

## 2023-12-12 NOTE — Progress Notes (Signed)
Arrived to patient room. Patient currently not available at this time. Will follow up. Tomasita Morrow, RN VAST

## 2023-12-13 MED ORDER — LABETALOL HCL 5 MG/ML IV SOLN: 40.0000 mg | INTRAVENOUS | Status: DC | PRN

## 2023-12-13 MED ORDER — LABETALOL HCL 5 MG/ML IV SOLN: 20.0000 mg | INTRAVENOUS | Status: DC | PRN

## 2023-12-13 MED ORDER — OXYCODONE HCL 5 MG PO TABS
5.0000 mg | ORAL_TABLET | Freq: Four times a day (QID) | ORAL | Status: DC | PRN
  Administered 2023-12-13 – 2023-12-14 (×3): 5 mg via ORAL
  Filled 2023-12-13 (×4): qty 1

## 2023-12-13 MED ORDER — HYDRALAZINE HCL 20 MG/ML IJ SOLN: 10.0000 mg | INTRAMUSCULAR | Status: DC | PRN

## 2023-12-13 MED ORDER — HYDRALAZINE HCL 20 MG/ML IJ SOLN
5.0000 mg | INTRAMUSCULAR | Status: DC | PRN
  Administered 2023-12-13: 5 mg via INTRAVENOUS
  Filled 2023-12-13: qty 1

## 2023-12-13 MED ORDER — NIFEDIPINE ER OSMOTIC RELEASE 30 MG PO TB24
30.0000 mg | ORAL_TABLET | Freq: Every day | ORAL | Status: DC
  Administered 2023-12-13 – 2023-12-14 (×2): 30 mg via ORAL
  Filled 2023-12-13 (×2): qty 1

## 2023-12-13 NOTE — Lactation Note (Signed)
This note was copied from a baby's chart.  NICU Lactation Consultation Note  Patient Name: Shannon [name] ZOXWR'U Date: 12/13/2023 Age:19 hours  Reason for consult: Follow-up assessment; NICU baby; 1st time breastfeeding; Late-preterm 34-36.6wks; Infant < 6lbs  SUBJECTIVE  LC in to visit with P2 Mom of LPTI in the NICU.   Mom has been consistently pumping and thrilled as she expressed 10 ml at last pumping.  She received milk labels from baby's nurse.  Mom states she hasn't been able to hold baby STS yet, but knows to pump after STS.  Mom is not for discharge today.  LC reminded her of the [facility] loaner she can obtain due to difficulty obtaining a WIC appt over the holidays, and being discharged on a weekend.  Mom denies any questions.  Encouraged her to continue her consistent pumping for her little baby.  OBJECTIVE Infant data: No data recorded O2 Device: Room Air  Infant feeding assessment IDFTS - Readiness: 3 IDFTS - Quality: 3   Maternal data: G2P1102 Vaginal, Spontaneous Pumping frequency: 8 times per 24 hrs Pumped volume: 10 mL Flange Size: 21 Hands-free pumping top sizes: Large [name])  WIC Program: Yes WIC Referral Sent?: Yes What county?: [place] Pump:  (Mom informed of WIC loaner available)  ASSESSMENT Infant:  Feeding Status: Scheduled 9-12-3-6 Feeding method: Tube/Gavage (Bolus)  Maternal: Milk volume: Normal  INTERVENTIONS/PLAN Interventions: Interventions: Breast feeding basics reviewed; Skin to skin; Breast massage; Hand express; DEBP; Education Tools: Pump; Flanges; Hands-free pumping top Pump Education: Setup, frequency, and cleaning; Milk Storage  Plan: Consult Status: NICU follow-up NICU Follow-up type: Maternal D/C visit; Verify absence of engorgement; Verify onset of copious milk   Shannon [name] 12/13/2023, 1:24 PM

## 2023-12-13 NOTE — Progress Notes (Signed)
Post Partum Day 2 Subjective:  Shannon [name] is a 17 y.o. [id number] [redacted]w[redacted]d s/p SVD.  No acute events overnight.  Pt denies problems with ambulating, voiding or po intake.  She denies nausea or vomiting.  Pain is well controlled.  She has had flatus.  Lochia Minimal.  Plan for birth control is  stil unsure see AP .  Method of Feeding: breast, working with NICU LC  Objective: Blood pressure (!) 158/84, pulse 91, temperature 98.9 F (37.2 C), temperature source Oral, resp. rate 17, height 4\' 9"  (1.448 m), weight 78.8 kg, last menstrual period 04/06/2023, SpO2 98%, unknown if currently breastfeeding.  Patient Vitals for the past 24 hrs:  BP Temp Temp src Pulse Resp SpO2  12/13/23 0801 (!) 158/84 98.9 F (37.2 C) Oral 91 17 98 %  12/13/23 0337 (!) 159/85 -- -- 92 16 98 %  12/12/23 2201 (!) 148/77 -- -- 84 -- --  12/12/23 2132 (!) 164/85 -- -- 82 -- --  12/12/23 2053 (!) 172/92 -- -- 89 -- --  12/12/23 2022 (!) 161/87 98.1 F (36.7 C) Oral 79 16 99 %  12/12/23 1957 (!) 165/93 -- -- 88 -- --  12/12/23 1955 (!) 164/90 -- -- 83 -- --  12/12/23 1125 (!) 145/89 -- -- 87 17 99 %    Physical Exam:  General: alert, cooperative and no distress Lochia:normal flow Chest: normal WOB Heart: Regular rate Abdomen: +BS, soft, mild TTP (appropriate) Uterine Fundus: firm, below umb DVT Evaluation: No evidence of DVT seen on physical exam. Extremities: No edema  Recent Labs    12/11/23 0734 12/11/23 1633  HGB 10.7* 10.1*  HCT 32.9* 30.8*    Assessment/Plan:  ASSESSMENT: Shannon [name] is a 17 y.o. [id number] [redacted]w[redacted]d s/p SVD  Plan for discharge tomorrow Continue routine PP care Breastfeeding support PRN  #PEC with severe features: s/p magnesium. Current medications are Labetalol 200mg  BID, Lasix and Norvasc 10mg . Recently increased to 10mg  norvasc over night due to elevated bps. Will kep for additional day to assure no addition medication increases are needed.   Discussed circumcision today-  patient is unsure about whether they want circumcision but full consent discussion was had and documented in the infants chart.   Reviewed reproductive life planning. Patient is not sure she wants more children but does not know what she wants to do for prevention. She will discuss at 4-6 wk visit  Mother is tearful today about possible d/c and we had a lengthy discussion validating her emotions.   LOS: 3 days   Federico Flake 12/13/2023, 10:12 AM

## 2023-12-13 NOTE — Plan of Care (Signed)
  Problem: Education: Goal: Knowledge of disease or condition will improve Outcome: Progressing Goal: Knowledge of the prescribed therapeutic regimen will improve Outcome: Progressing   Problem: Fluid Volume: Goal: Peripheral tissue perfusion will improve Outcome: Progressing   Problem: Clinical Measurements: Goal: Complications related to disease process, condition or treatment will be avoided or minimized Outcome: Progressing   Problem: Education: Goal: Knowledge of Childbirth will improve Outcome: Progressing Goal: Ability to make informed decisions regarding treatment and plan of care will improve Outcome: Progressing Goal: Ability to state and carry out methods to decrease the pain will improve Outcome: Progressing Goal: Individualized Educational Video(s) Outcome: Progressing   Problem: Coping: Goal: Ability to verbalize concerns and feelings about labor and delivery will improve Outcome: Progressing   Problem: Life Cycle: Goal: Ability to make normal progression through stages of labor will improve Outcome: Progressing Goal: Ability to effectively push during vaginal delivery will improve Outcome: Progressing   Problem: Role Relationship: Goal: Will demonstrate positive interactions with the child Outcome: Progressing   Problem: Safety: Goal: Risk of complications during labor and delivery will decrease Outcome: Progressing   Problem: Pain Management: Goal: Relief or control of pain from uterine contractions will improve Outcome: Progressing   Problem: Education: Goal: Knowledge of General Education information will improve Description: Including pain rating scale, medication(s)/side effects and non-pharmacologic comfort measures Outcome: Progressing   Problem: Health Behavior/Discharge Planning: Goal: Ability to manage health-related needs will improve Outcome: Progressing   Problem: Clinical Measurements: Goal: Ability to maintain clinical measurements  within normal limits will improve Outcome: Progressing Goal: Will remain free from infection Outcome: Progressing Goal: Diagnostic test results will improve Outcome: Progressing Goal: Respiratory complications will improve Outcome: Progressing Goal: Cardiovascular complication will be avoided Outcome: Progressing   Problem: Activity: Goal: Risk for activity intolerance will decrease Outcome: Progressing   Problem: Nutrition: Goal: Adequate nutrition will be maintained Outcome: Progressing   Problem: Coping: Goal: Level of anxiety will decrease Outcome: Progressing   Problem: Elimination: Goal: Will not experience complications related to bowel motility Outcome: Progressing Goal: Will not experience complications related to urinary retention Outcome: Progressing   Problem: Pain Management: Goal: General experience of comfort will improve Outcome: Progressing   Problem: Safety: Goal: Ability to remain free from injury will improve Outcome: Progressing   Problem: Skin Integrity: Goal: Risk for impaired skin integrity will decrease Outcome: Progressing   Problem: Education: Goal: Knowledge of condition will improve Outcome: Progressing Goal: Individualized Educational Video(s) Outcome: Progressing Goal: Individualized Newborn Educational Video(s) Outcome: Progressing   Problem: Activity: Goal: Will verbalize the importance of balancing activity with adequate rest periods Outcome: Progressing Goal: Ability to tolerate increased activity will improve Outcome: Progressing   Problem: Coping: Goal: Ability to identify and utilize available resources and services will improve Outcome: Progressing   Problem: Life Cycle: Goal: Chance of risk for complications during the postpartum period will decrease Outcome: Progressing   Problem: Role Relationship: Goal: Ability to demonstrate positive interaction with newborn will improve Outcome: Progressing   Problem: Skin  Integrity: Goal: Demonstration of wound healing without infection will improve Outcome: Progressing

## 2023-12-14 ENCOUNTER — Ambulatory Visit (HOSPITAL_COMMUNITY): Payer: Self-pay

## 2023-12-14 MED ORDER — FUROSEMIDE 20 MG PO TABS: 20.0000 mg | ORAL_TABLET | Freq: Every day | ORAL | 0 refills | Status: AC

## 2023-12-14 MED ORDER — IBUPROFEN 600 MG PO TABS: 600.0000 mg | ORAL_TABLET | Freq: Four times a day (QID) | ORAL | 0 refills | Status: AC

## 2023-12-14 MED ORDER — NIFEDIPINE ER 30 MG PO TB24: 30.0000 mg | ORAL_TABLET | Freq: Every day | ORAL | 1 refills | Status: AC

## 2023-12-14 NOTE — Plan of Care (Signed)
  Problem: Education: Goal: Knowledge of disease or condition will improve Outcome: Completed/Met Goal: Knowledge of the prescribed therapeutic regimen will improve Outcome: Completed/Met   Problem: Fluid Volume: Goal: Peripheral tissue perfusion will improve Outcome: Completed/Met   Problem: Clinical Measurements: Goal: Complications related to disease process, condition or treatment will be avoided or minimized Outcome: Completed/Met   Problem: Education: Goal: Knowledge of Childbirth will improve Outcome: Completed/Met Goal: Ability to make informed decisions regarding treatment and plan of care will improve Outcome: Completed/Met Goal: Ability to state and carry out methods to decrease the pain will improve Outcome: Completed/Met Goal: Individualized Educational Video(s) Outcome: Completed/Met   Problem: Coping: Goal: Ability to verbalize concerns and feelings about labor and delivery will improve Outcome: Completed/Met   Problem: Life Cycle: Goal: Ability to make normal progression through stages of labor will improve Outcome: Completed/Met Goal: Ability to effectively push during vaginal delivery will improve Outcome: Completed/Met   Problem: Role Relationship: Goal: Will demonstrate positive interactions with the child Outcome: Completed/Met   Problem: Safety: Goal: Risk of complications during labor and delivery will decrease Outcome: Completed/Met   Problem: Pain Management: Goal: Relief or control of pain from uterine contractions will improve Outcome: Completed/Met   Problem: Education: Goal: Knowledge of General Education information will improve Description: Including pain rating scale, medication(s)/side effects and non-pharmacologic comfort measures Outcome: Completed/Met   Problem: Health Behavior/Discharge Planning: Goal: Ability to manage health-related needs will improve Outcome: Completed/Met   Problem: Clinical Measurements: Goal: Ability  to maintain clinical measurements within normal limits will improve Outcome: Completed/Met Goal: Will remain free from infection Outcome: Completed/Met Goal: Diagnostic test results will improve Outcome: Completed/Met Goal: Respiratory complications will improve Outcome: Completed/Met Goal: Cardiovascular complication will be avoided Outcome: Completed/Met   Problem: Activity: Goal: Risk for activity intolerance will decrease Outcome: Completed/Met   Problem: Nutrition: Goal: Adequate nutrition will be maintained Outcome: Completed/Met   Problem: Coping: Goal: Level of anxiety will decrease Outcome: Completed/Met   Problem: Elimination: Goal: Will not experience complications related to bowel motility Outcome: Completed/Met Goal: Will not experience complications related to urinary retention Outcome: Completed/Met   Problem: Pain Management: Goal: General experience of comfort will improve Outcome: Completed/Met   Problem: Safety: Goal: Ability to remain free from injury will improve Outcome: Completed/Met   Problem: Skin Integrity: Goal: Risk for impaired skin integrity will decrease Outcome: Completed/Met   Problem: Education: Goal: Knowledge of condition will improve Outcome: Completed/Met Goal: Individualized Educational Video(s) Outcome: Completed/Met Goal: Individualized Newborn Educational Video(s) Outcome: Completed/Met   Problem: Activity: Goal: Will verbalize the importance of balancing activity with adequate rest periods Outcome: Completed/Met Goal: Ability to tolerate increased activity will improve Outcome: Completed/Met   Problem: Coping: Goal: Ability to identify and utilize available resources and services will improve Outcome: Completed/Met   Problem: Life Cycle: Goal: Chance of risk for complications during the postpartum period will decrease Outcome: Completed/Met   Problem: Role Relationship: Goal: Ability to demonstrate positive  interaction with newborn will improve Outcome: Completed/Met   Problem: Skin Integrity: Goal: Demonstration of wound healing without infection will improve Outcome: Completed/Met

## 2023-12-14 NOTE — Lactation Note (Signed)
This note was copied from a baby's chart. Lactation Consultation Note  Patient Name: Shannon [name] [id number] Date: 12/14/2023 Age:19 hours   This LC opened a new pump kit and set up a DEBP in baby's room per Ms. Kessner request, pump kit charge added. Continue current plan of care.   Shannon [name] 12/14/2023, 7:38 PM

## 2023-12-14 NOTE — Lactation Note (Signed)
This note was copied from a baby's chart.  NICU Lactation Consultation Note  Patient Name: Shannon [name] ZOXWR'U Date: 12/14/2023 Age:19 hours  Reason for consult: Follow-up assessment; NICU baby; Maternal endocrine disorder; Other (Comment); Late-preterm 34-36.6wks; 1st time breastfeeding; Maternal discharge (teen pregnancy, SGA, IUGR, gHTN) Type of Endocrine Disorder?: Thyroid (hypothyroid (synthroid))  SUBJECTIVE Visited with family of 67 64/70 weeks old AGA NICU female; Shannon [name] is a P2 and reports she's been pumping consistently for baby "Shannon [name]"; her supply continues to increase, praised her for all her efforts. She's getting discharged from [facility] today. Reviewed discharge education and the importance of consistent pumping for the prevention of engorgement and to protect her supply. She was very appreciate of all the lactation care she has gotten this time, unlike 3 years ago when she had her daughter.   OBJECTIVE Infant data: Mother's Current Feeding Choice: Breast Milk and Donor Milk  O2 Device: Room Air  Infant feeding assessment IDFTS - Readiness: 3   Maternal data: G2P1102 Vaginal, Spontaneous Pumping frequency: 8 times/24 hours Pumped volume: 20 mL Flange Size: 21 Hands-free pumping top sizes: Large [name])  WIC Program: Yes WIC Referral Sent?: Yes What county?: [place] Pump: [facility] Loaner [employer] loaner @ [phone] with return date of 12/26/23)  ASSESSMENT Infant: Feeding Status: Continuous gastric feedings Feeding method: Continuous Gastric  Maternal: Milk volume: Normal  INTERVENTIONS/PLAN Interventions: Interventions: Breast feeding basics reviewed; Coconut oil; DEBP; Education Discharge Education: Engorgement and breast care Tools: Pump; Flanges; Hands-free pumping top; Coconut oil Pump Education: Setup, frequency, and cleaning; Milk Storage  Plan: Encouraged pumping every 3 hours, ideally 8 pumping sessions/24 hours LC to set up DEBP in  Shannon [name]'s room, she took all pump pieces to be used with loaner pump She'll call for assistance when Shannon [name] is ready to go to breast   Shannon [name] and baby's grandfather present and supportive. All questions and concerns answered, family to contact [facility] services PRN.   Consult Status: NICU follow-up NICU Follow-up type: Verify absence of engorgement; Weekly NICU follow up   Shannon [name] 12/14/2023, 11:33 AM

## 2023-12-15 ENCOUNTER — Other Ambulatory Visit: Payer: Medicaid Other

## 2023-12-15 ENCOUNTER — Ambulatory Visit: Payer: Medicaid Other

## 2023-12-16 ENCOUNTER — Inpatient Hospital Stay (HOSPITAL_COMMUNITY)
Admission: RE | Admit: 2023-12-16 | Payer: Medicaid Other | Source: Home / Self Care | Admitting: Obstetrics and Gynecology

## 2023-12-16 ENCOUNTER — Inpatient Hospital Stay (HOSPITAL_COMMUNITY): Payer: Medicaid Other

## 2023-12-16 LAB — SURGICAL PATHOLOGY

## 2023-12-18 ENCOUNTER — Encounter: Payer: Medicaid Other | Admitting: Obstetrics and Gynecology

## 2023-12-19 ENCOUNTER — Telehealth (HOSPITAL_COMMUNITY): Payer: Self-pay | Admitting: *Deleted

## 2023-12-19 NOTE — Telephone Encounter (Signed)
12/19/2023  Name: Shannon [name] MRN: [id number] DOB: [birth year]  Reason for Call:  Transition of Care Hospital Discharge Call  Contact Status: Patient Contact Status: Complete  Language assistant needed: Interpreter Mode: Interpreter Not Needed        Follow-Up Questions: Do You Have Any Concerns About Your Health As You Heal From Delivery?: No Do You Have Any Concerns About Your Infants Health?: Infant in NICU  Edinburgh Postnatal Depression Scale:  In the Past 7 Days: I have been able to laugh and see the funny side of things.: As much as I always could I have looked forward with enjoyment to things.: As much as I ever did I have blamed myself unnecessarily when things went wrong.: Not very often I have been anxious or worried for no good reason.: No, not at all I have felt scared or panicky for no good reason.: No, not at all Things have been getting on top of me.: No, I have been coping as well as ever I have been so unhappy that I have had difficulty sleeping.: Not at all I have felt sad or miserable.: No, not at all I have been so unhappy that I have been crying.: No, never The thought of harming myself has occurred to me.: Never Inocente Salles Postnatal Depression Scale Total: 1  PHQ2-9 Depression Scale:     Discharge Follow-up: Edinburgh score requires follow up?: No Patient was advised of the following resources:: Support Group, Breastfeeding Support Group (declines postpartum group information via email)  Post-discharge interventions: Reviewed Newborn Safe Sleep Practices  Salena Saner, RN 12/19/2023 10:23

## 2023-12-25 ENCOUNTER — Encounter: Payer: Medicaid Other | Admitting: Obstetrics & Gynecology

## 2023-12-29 ENCOUNTER — Ambulatory Visit (HOSPITAL_COMMUNITY): Payer: Self-pay

## 2023-12-29 NOTE — Lactation Note (Signed)
 This note was copied from a baby's chart.  NICU Lactation Consultation Note  Patient Name: Shannon [name] Date: 12/29/2023 Age:20 wk.o.  Reason for consult: Weekly NICU follow-up; Other (Comment); Maternal endocrine disorder; Early term 29-38.6wks; 1st time breastfeeding; Mother's request; RN request; Breastfeeding assistance; NICU baby; Infant < 6lbs (teen pregnancy, SGA, IUGR, gHTN) Type of Endocrine Disorder?: Thyroid  (hypothyroid (synthroid ))  SUBJECTIVE Visited with family of 39 33/61 weeks old AGA NICU female; Shannon [name] is a P3 and reported she's been pumping consistently for baby Shannon [name] her supply has increased, praised her for her efforts. Shannon [name] is currently being managed for [facility] and has resumed feedings of breastmilk after being on NPO for several days. This LC assisted with the first latch and took [name] STS to breast first in cross cradle (he didn't latch) and then switch to football with the same results. Once NS # 20 was introduced he was able to stay latched on briefly but needed continues suck training and repositioning every time he would let the NS go. He started crying on last attempt after being sucking at the breast on and off for 2 minutes. NNS was the only pattern noted during this feeding, no EBM noted on NS either. Left couplet engaging in STS care. Revised IDF guidelines and suggested to try future feedings braless (the bra kept pushing off the NS).   OBJECTIVE Infant data: Mother's Current Feeding Choice: Breast Milk  O2 Device: Room Air  Infant feeding assessment IDFTS - Readiness: 2 IDFTS - Quality: 4   Maternal data: G2P1102 Vaginal, Spontaneous Pumping frequency: 8 times/24 hours Pumped volume: 60 mL (60-90 ml; up to 120 ml in the AM) Flange Size: 21 Hands-free pumping top sizes: Large [place])  WIC Program: Yes WIC Referral Sent?: Yes What county?: [place] Pump: WIC Pump  ASSESSMENT Infant: Latch: Repeated attempts needed  to sustain latch, nipple held in mouth throughout feeding, stimulation needed to elicit sucking reflex. Audible Swallowing: None Type of Nipple: Everted at rest and after stimulation Comfort (Breast/Nipple): Soft / non-tender Hold (Positioning): Assistance needed to correctly position infant at breast and maintain latch. LATCH Score: 6  Feeding Status: Scheduled 9-12-3-6 Feeding method: Breast Nipple Type: Nfant Slow Flow (purple)  Maternal: Milk volume: Normal  INTERVENTIONS/PLAN Interventions: Interventions: Breast feeding basics reviewed; Coconut oil; DEBP; Education; Breast massage; Breast compression; Assisted with latch; Skin to skin; Adjust position Tools: Pump; Flanges; Coconut oil; Hands-free pumping top; Nipple Delene Pump Education: Setup, frequency, and cleaning; Milk Storage Nipple shield size: 20  Plan: Encouraged to continue pumping every 3 hours, ideally 8 pumping sessions/24 hours She'll start taking baby to [name] to breast on feeding cues around feeding times and will use NS # 20 PRN. She'll try to get her first let down prior latching with hand expression Parents will continue advancing on bottle feedings   FOB present. All questions and concerns answered, family to contact [facility] services PRN.  Consult Status: NICU follow-up NICU Follow-up type: Weekly NICU follow up   Brennyn Haisley S Miriam 12/29/2023, 12:46 PM

## 2024-01-01 ENCOUNTER — Encounter: Payer: Medicaid Other | Admitting: Obstetrics and Gynecology

## 2024-01-07 ENCOUNTER — Ambulatory Visit (HOSPITAL_COMMUNITY): Payer: Self-pay

## 2024-01-07 NOTE — Lactation Note (Signed)
 This note was copied from a baby's chart.  NICU Lactation Consultation Note  Patient Name: Boy Shyane Dattilo ZOXWR'U Date: 01/07/2024 Age:20 wk.o.  Reason for consult: Weekly NICU follow-up; Other (Comment); Maternal endocrine disorder; Early term 36-38.6wks; 1st time breastfeeding; NICU baby; Breastfeeding assistance; Mother's request; RN request; Infant < 6lbs (teen pregnancy, SGA, IUGR) Type of Endocrine Disorder?: Thyroid  (hypothyroid (synthroid ))  SUBJECTIVE Visited with family of 24 20/1 weeks old AGA NICU female; Ms. Northrop is a P3 and reports she's pumping consistently and her supply has increased again, praised her for all her efforts. She started taking baby "[name]" to breast last night; and then again this morning; this LC only witness the last 10 minutes of the feeding. Baby Lynder Sanger already latched to the L side in cross cradle hold with NS # 20 but sucking was not consistent, he was getting frustrated, only a few swallows heard (see LATCH score). Ms. Mexicano voiced that Lynder Sanger did so well last night and she was surprised that he's not doing as well this morning. Let her know that each feeding will be a different experience for baby and that it might vary from time to time. Noticed that there is a trend for weight loss, most likely not related to breastfeeding since he has only gone to breast twice within the last week. Due to weight trend and only a few swallows, asked NICU RN [facility] to do 1/3 of his feeding via gavage.   OBJECTIVE Infant data: Mother's Current Feeding Choice: Breast Milk  O2 Device: Room Air  Infant feeding assessment IDFTS - Readiness: 1 IDFTS - Quality: 2   Maternal data: G2P1102 Vaginal, Spontaneous Pumping frequency: 6-8 times/24 hours Pumped volume: 90 mL (90-120 ml; up to 150-180 ml in the AM)  WIC Program: Yes WIC Referral Sent?: Yes What county?: [place] Pump: WIC Pump  ASSESSMENT Infant: Latch: Repeated attempts needed to sustain  latch, nipple held in mouth throughout feeding, stimulation needed to elicit sucking reflex. Audible Swallowing: A few with stimulation Type of Nipple: Everted at rest and after stimulation (L nipple was everted) Comfort (Breast/Nipple): Soft / non-tender Hold (Positioning): No assistance needed to correctly position infant at breast. (minimal assistance needed) LATCH Score: 8  Feeding Status: Scheduled 9-12-3-6 Feeding method: Breast; Tube/Gavage (Bolus) Nipple Type: Dr. Michelene Ahmadi Preemie  Maternal: Milk volume: Normal  INTERVENTIONS/PLAN Interventions: Interventions: Breast feeding basics reviewed; Assisted with latch; Breast massage; Breast compression; Adjust position; Support pillows; DEBP; Education; Infant Driven Feeding Algorithm education  Plan: Encouraged to continue pumping every 3 hours, ideally 8 pumping sessions/24 hours She'll continue taking baby to [place] to breast on feeding cues around feeding times and will use NS # 20 PRN. Will limit feedings/attempts at the breast to < 30 minutes/time  Parents will continue advancing on bottle feedings   MGM present. All questions and concerns answered, family to contact [facility] services PRN.  Consult Status: NICU follow-up NICU Follow-up type: Weekly NICU follow up   Jalyn Rosero S Clementine Cutting 01/07/2024, 10:59 AM

## 2024-01-10 NOTE — Progress Notes (Signed)
Circumcision Consent  Discussed with mom at over phone about circumcision.   Circumcision is a surgery that removes the skin that covers the tip of the penis, called the "foreskin." Circumcision is usually done when a boy is between 97 and 70 days old, sometimes up to 51-76 weeks old.  The most common reasons boys are circumcised include for cultural/religious beliefs or for parental preference (potentially easier to clean, so baby looks like daddy, etc).  There may be some medical benefits for circumcision:   Circumcised boys seem to have slightly lower rates of: ? Urinary tract infections (per the American Academy of Pediatrics an uncircumcised boy has a 1/100 chance of developing a UTI in the first year of life, a circumcised boy at a 12/998 chance of developing a UTI in the first year of life- a 10% reduction) ? Penis cancer (typically rare- an uncircumcised female has a 1 in 100,000 chance of developing cancer of the penis) ? Sexually transmitted infection (in endemic areas, including HIV, HPV and Herpes- circumcision does NOT protect against gonorrhea, chlamydia, trachomatis, or syphilis) ? Phimosis: a condition where that makes retraction of the foreskin over the glans impossible (0.4 per 1000 boys per year or 0.6% of boys are affected by their 15th birthday)  Boys and men who are not circumcised can reduce these extra risks by: ? Cleaning their penis well ? Using condoms during sex  What are the risks of circumcision?  As with any surgical procedure, there are risks and complications. In circumcision, complications are rare and usually minor, the most common being: ? Bleeding- risk is reduced by holding each clamp for 30 seconds prior to a cut being made, and by holding pressure after the procedure is done ? Infection- the penis is cleaned prior to the procedure, and the procedure is done under sterile technique ? Damage to the urethra or amputation of the penis  How is circumcision  done in baby boys?  The baby will be placed on a special table and the legs restrained for their safety. Numbing medication is injected into the penis, and the skin is cleansed with betadine to decrease the risk of infection.   What to expect:  The penis will look red and raw for 5-7 days as it heals. We expect scabbing around where the cut was made, as well as clear-pink fluid and some swelling of the penis right after the procedure. If your baby's circumcision starts to bleed or develops pus, please contact your pediatrician immediately.  All questions were answered and mother consented.  Scheryl Darter MD Patient ID: Shannon [name], female   DOB: Mar 14, 2006, 18 y.o.   MRN: [id number]

## 2024-01-11 ENCOUNTER — Ambulatory Visit (HOSPITAL_COMMUNITY): Payer: Self-pay

## 2024-01-11 NOTE — Lactation Note (Signed)
This note was copied from a baby's chart.  NICU Lactation Consultation Note  Patient Name: Shannon [name] Date: 01/11/2024 Age:20 wk.o.  Reason for consult: Follow-up assessment; Primapara; 1st time breastfeeding; NICU baby; Term; Maternal endocrine disorder; Breastfeeding assistance; RN request Type of Endocrine Disorder?: Thyroid  SUBJECTIVE  LC in to assist with feeding at the breast.  Mom has been putting baby on the breast once or twice a day when she visits.  Baby is very alert and showing feeding cues.  Mom has been pumping consistently and has a great milk supply.  Mom using cross cradle hold, adjusted Mom to support and sandwich her breast in a U hold to facilitate baby latching deeper.  Pillow support added to elevate baby to height of breast. Baby fussy and popping on and off.  Milk drops dripping and baby unable to sustain a deep latch.  LC initiated the 20 mm nipple shield, showing how to invert half way and stretch over nipple.  The NS helped baby to calm down as it helped to organize his sucking.  Baby's whole body relaxed and Mom provided with support under her hands and elbows and encouraged to relax as well.   LC watched as baby was sucking with deep jaw extensions and swallows identified.  Baby paused and would start back up sucking with some NNS, but would become more nutritive with breast compression during sucking.    Mom continues pumping after breastfeeding.  Talked about OP lactation F/U. Talked about a weighted feeding tomorrow if baby isn't discharged.  Talked about the ability to see how baby transfers at the breast.  Mom interested in this.  OBJECTIVE Infant data: No data recorded O2 Device: Room Air  Infant feeding assessment IDFTS - Readiness: 1 IDFTS - Quality: 2   Maternal data: G2P1102 Vaginal, Spontaneous Pumping frequency: 8 times per 24 hrs Pumped volume: 120 mL Flange Size: 21 Hands-free pumping top sizes: Large Shannon [name])  WIC  Program: Yes WIC Referral Sent?: Yes What county?: [place] Pump: WIC Pump  ASSESSMENT Infant: Latch: Grasps breast easily, tongue down, lips flanged, rhythmical sucking. (with nipple shield 20 mm) Audible Swallowing: Spontaneous and intermittent Type of Nipple: Everted at rest and after stimulation Comfort (Breast/Nipple): Soft / non-tender Hold (Positioning): Assistance needed to correctly position infant at breast and maintain latch. LATCH Score: 9  Feeding Status: Ad lib Feeding method: Breast Nipple Type: Shannon [name] Preemie  Maternal: Milk volume: Normal  INTERVENTIONS/PLAN Interventions: Interventions: Breast feeding basics reviewed; Assisted with latch; Skin to skin; Breast massage; Hand express; Breast compression; Adjust position; Support pillows; Position options; Expressed milk; DEBP Tools: Pump; Flanges; Nipple [name]; Bottle; Hands-free pumping top Pump Education: Setup, frequency, and cleaning; Milk Storage Nipple shield size: 20  Plan: Consult Status: NICU follow-up NICU Follow-up type: Baby's discharge   Shannon [name] 01/11/2024, 4:44 PM

## 2024-01-12 ENCOUNTER — Ambulatory Visit (HOSPITAL_COMMUNITY): Payer: Self-pay

## 2024-01-12 NOTE — Lactation Note (Signed)
This note was copied from a baby's chart.  NICU Lactation Consultation Note  Patient Name: Shannon [name] [id number] Date: 01/12/2024 Age:20 wk.o.  Reason for consult: Follow-up assessment; Primapara; 1st time breastfeeding; NICU baby; Term; Infant < 6lbs; Maternal endocrine disorder Type of Endocrine Disorder?: Thyroid  SUBJECTIVE  LC in to visit with P1 Mom of term baby on day of baby's discharge.  Baby's weight gain was adequate for baby to go home ad lib feeding at the breast.  LC wanted to do a pre and post feeding weight, but baby in the car seat and Mom anxious to leave.  Ped appt tomorrow and LC talked about the importance of F/U with OP lactation consultant.  LC states she will message the clinic and she will  receive a call to set up an appointment.  LC described the reasoning and benefit of a weighted feed to help Mom know how much she will need to be pumping and supplementing.  Consult will be shared with baby's Pediatrician.    Mom was very sure that baby was doing well breastfeeding as he comes off the breast contented.  Baby has been latching and breastfeeding 1-3 times per day for a few days.  LC really encouraged Mom to take this appt in order to be successful and have all her questions and concerns regarding baby's growth at the breast, answered.  LC sent a message to Shannon Stain RN IBCLC at [facility], to reach out to this Mom.  No data recorded O2 Device: Room Air  Infant feeding assessment IDFTS - Readiness: 1 IDFTS - Quality: 2   Maternal data: G2P1102 Vaginal, Spontaneous Pumping frequency: Pumping only when baby isn't breastfeeding Pumped volume: 90 mL (90-180 ml) Flange Size: 21 Hands-free pumping top sizes: Large [name])  WIC Program: Yes WIC Referral Sent?: Yes What county?: [place] Pump: WIC Pump  ASSESSMENT Infant:   Feeding Status: Ad lib Feeding method: Bottle Nipple Type: [name]  Maternal: Milk volume:  Normal  INTERVENTIONS/PLAN Interventions: Interventions: Hand express; Breast massage; Skin to skin; DEBP; LC Services brochure; Education Discharge Education: Outpatient recommendation; Outpatient Epic message sent Tools: Pump; Flanges; Nipple [name]; Bottle Pump Education: Setup, frequency, and cleaning; Milk Storage Nipple shield size: 20  Plan: Consult Status: Complete NICU Follow-up type: Baby's discharge   Shannon [name] 01/12/2024, 11:39 AM

## 2024-01-25 ENCOUNTER — Other Ambulatory Visit: Payer: Self-pay | Admitting: Certified Nurse Midwife

## 2024-01-26 ENCOUNTER — Encounter: Payer: Self-pay | Admitting: Certified Nurse Midwife

## 2024-01-28 ENCOUNTER — Ambulatory Visit: Payer: Medicaid Other | Admitting: Obstetrics & Gynecology

## 2024-01-28 DIAGNOSIS — Z30017 Encounter for initial prescription of implantable subdermal contraceptive: Secondary | ICD-10-CM | POA: Diagnosis not present

## 2024-01-28 DIAGNOSIS — Z3202 Encounter for pregnancy test, result negative: Secondary | ICD-10-CM | POA: Diagnosis not present

## 2024-01-28 LAB — POCT URINE PREGNANCY: Preg Test, Ur: NEGATIVE

## 2024-01-28 MED ORDER — ETONOGESTREL 68 MG ~~LOC~~ IMPL
68.0000 mg | DRUG_IMPLANT | Freq: Once | SUBCUTANEOUS | Status: AC
  Administered 2024-01-28: 68 mg via SUBCUTANEOUS

## 2024-01-28 NOTE — Progress Notes (Signed)
    Post Partum Visit Note  Shannon [name] is a 18 y.o. G75P1102 female who presents for a postpartum visit. She is 6 weeks postpartum following a normal spontaneous vaginal delivery.  I have fully reviewed the prenatal and intrapartum course. The delivery was at 35.4 gestational weeks.  Anesthesia: epidural. Postpartum course has been good. Baby is doing well. Baby is feeding by both breast and bottle - Similac Neosure. Bleeding no bleeding. Bowel function is normal. Bladder function is normal. Patient is not sexually active. Contraception method is abstinence.   Postpartum depression screening: negative, score 1.   The pregnancy intention screening data noted above was reviewed. Potential methods of contraception were discussed. The patient elected to proceed with No data recorded.    Health Maintenance Due  Topic Date Due   Pneumococcal Vaccine 72-66 Years old (1 of 2 - PCV) Never done   HPV VACCINES (1 - 3-dose series) Never done   INFLUENZA VACCINE  07/24/2023   DTaP/Tdap/Td (1 - Tdap) Never done   COVID-19 Vaccine (1 - 2024-25 season) Never done    The following portions of the patient's history were reviewed and updated as appropriate: allergies, current medications, past family history, past medical history, past social history, past surgical history, and problem list.  Review of Systems Pertinent items are noted in HPI.  Objective:  LMP 04/06/2023 (Exact Date) Comment: pt reports normal for her to have irregular periods   General:  alert, cooperative, and no distress   Breasts:    Lungs:   Heart:  regular rate and rhythm  Abdomen: soft, non-tender; bowel sounds normal; no masses,  no organomegaly   Wound   GU exam:  not indicated       Assessment:    There are no diagnoses linked to this encounter.  normal postpartum exam.   Plan:   Essential components of care per ACOG recommendations:  1.  Mood and well being: Patient with negative depression screening  today. Reviewed local resources for support.  - Patient tobacco use? No.   - hx of drug use? No.    2. Infant care and feeding:  -Patient currently breastmilk feeding? Yes. Discussed returning to work and pumping.  -Social determinants of health (SDOH) reviewed in EPIC. No concerns  3. Sexuality, contraception and birth spacing - Patient does not want a pregnancy in the next year.  Desired family size is 2 children.  - Reviewed reproductive life planning. Reviewed contraceptive methods based on pt preferences and effectiveness.  Patient desired Hormonal Implant today.   - Discussed birth spacing of 18 months  4. Sleep and fatigue -Encouraged family/partner/community support of 4 hrs of uninterrupted sleep to help with mood and fatigue  5. Physical Recovery  - Discussed patients delivery and complications. She describes her labor as good. - Patient had a Vaginal, no problems at delivery. Patient had no laceration. Perineal healing reviewed. Patient expressed understanding - Patient has urinary incontinence? No. - Patient is safe to resume physical and sexual activity  6.  Health Maintenance - HM due items addressed Yes - Last pap smear No results found for: DIAGPAP Pap smear not done at today's visit.  -Breast Cancer screening indicated? No.   7. Chronic Disease/Pregnancy Condition follow up: None  - PCP follow up Eveline Lynwood MATSU, MD Center for [facility], [facility]

## 2024-01-28 NOTE — Progress Notes (Signed)
 GYNECOLOGY OFFICE PROCEDURE NOTE  Shannon [name] is a 18 y.o. [id number] here for Nexplanon  insertion.  6 weeks PP. No other gynecologic concerns.  Nexplanon  Insertion Procedure Patient identified, informed consent performed, consent signed.   Patient does understand that irregular bleeding is a very common side effect of this medication. She was advised to have backup contraception for one week after placement. Pregnancy test in clinic today was negative.  Appropriate time out taken.  Patient's left arm was prepped and draped in the usual sterile fashion. The ruler used to measure and mark insertion area.  Patient was prepped with alcohol swab and then injected with 3 ml of 1% lidocaine .  She was prepped with betadine, Nexplanon  removed from packaging,  Device confirmed in needle, then inserted full length of needle and withdrawn per handbook instructions. Nexplanon  was able to palpated in the patient's arm; patient palpated the insert herself. There was minimal blood loss.  Patient insertion site covered with gauze and a pressure bandage to reduce any bruising.  The patient tolerated the procedure well and was given post procedure instructions.    Eveline Lynwood MATSU, MD Attending Obstetrician & Gynecologist, [facility] and Center for [facility]  01/28/2024

## 2024-12-13 ENCOUNTER — Other Ambulatory Visit: Payer: Self-pay

## 2024-12-13 ENCOUNTER — Emergency Department (HOSPITAL_COMMUNITY)
Admission: EM | Admit: 2024-12-13 | Discharge: 2024-12-14 | Discharge status: Against Medical Advice | Attending: Physician Assistant | Admitting: Physician Assistant

## 2024-12-13 ENCOUNTER — Emergency Department (HOSPITAL_COMMUNITY)

## 2024-12-13 ENCOUNTER — Encounter (HOSPITAL_COMMUNITY): Payer: Self-pay

## 2024-12-13 DIAGNOSIS — Z5321 Procedure and treatment not carried out due to patient leaving prior to being seen by health care provider: Secondary | ICD-10-CM | POA: Insufficient documentation

## 2024-12-13 DIAGNOSIS — K0889 Other specified disorders of teeth and supporting structures: Secondary | ICD-10-CM | POA: Insufficient documentation

## 2024-12-13 DIAGNOSIS — R519 Headache, unspecified: Secondary | ICD-10-CM | POA: Diagnosis present

## 2024-12-13 LAB — CBC WITH DIFFERENTIAL/PLATELET
Abs Immature Granulocytes: 0.01 K/uL (ref 0.00–0.07)
Basophils Absolute: 0 K/uL (ref 0.0–0.1)
Basophils Relative: 1 %
Eosinophils Absolute: 0.1 K/uL (ref 0.0–0.5)
Eosinophils Relative: 2 %
HCT: 41.4 % (ref 36.0–46.0)
Hemoglobin: 13.7 g/dL (ref 12.0–15.0)
Immature Granulocytes: 0 %
Lymphocytes Relative: 38 %
Lymphs Abs: 1.7 K/uL (ref 0.7–4.0)
MCH: 28.7 pg (ref 26.0–34.0)
MCHC: 33.1 g/dL (ref 30.0–36.0)
MCV: 86.8 fL (ref 80.0–100.0)
Monocytes Absolute: 0.4 K/uL (ref 0.1–1.0)
Monocytes Relative: 8 %
Neutro Abs: 2.4 K/uL (ref 1.7–7.7)
Neutrophils Relative %: 51 %
Platelets: 344 K/uL (ref 150–400)
RBC: 4.77 MIL/uL (ref 3.87–5.11)
RDW: 14.6 % (ref 11.5–15.5)
WBC: 4.6 K/uL (ref 4.0–10.5)
nRBC: 0 % (ref 0.0–0.2)

## 2024-12-13 LAB — COMPREHENSIVE METABOLIC PANEL WITH GFR
ALT: 15 U/L (ref 0–44)
AST: 26 U/L (ref 15–41)
Albumin: 4.3 g/dL (ref 3.5–5.0)
Alkaline Phosphatase: 128 U/L — ABNORMAL HIGH (ref 38–126)
Anion gap: 10 (ref 5–15)
BUN: 9 mg/dL (ref 6–20)
CO2: 26 mmol/L (ref 22–32)
Calcium: 9.1 mg/dL (ref 8.9–10.3)
Chloride: 103 mmol/L (ref 98–111)
Creatinine, Ser: 0.61 mg/dL (ref 0.44–1.00)
GFR, Estimated: >60 mL/min
Glucose, Bld: 98 mg/dL (ref 70–99)
Potassium: 4.2 mmol/L (ref 3.5–5.1)
Sodium: 138 mmol/L (ref 135–145)
Total Bilirubin: 0.2 mg/dL (ref 0.0–1.2)
Total Protein: 7.5 g/dL (ref 6.5–8.1)

## 2024-12-13 LAB — HCG, SERUM, QUALITATIVE: Preg, Serum: NEGATIVE

## 2024-12-13 MED ORDER — IOHEXOL 350 MG/ML SOLN
75.0000 mL | Freq: Once | INTRAVENOUS | Status: AC | PRN
  Administered 2024-12-13: 75 mL via INTRAVENOUS

## 2024-12-13 NOTE — ED Provider Triage Note (Signed)
 Emergency Medicine Provider Triage Evaluation Note  Shannon [name] , a 18 y.o. female  was evaluated in triage.  Pt complains of left facial swelling for the past 7 days.  Thought that she had a infected tooth, has not seen a dentist in the past 2 years.  Worsening pain with palpation.  No decrease in oral intake.  Review of Systems  Positive: Facial swelling, dental problem Negative: fever  Physical Exam  BP (!) 131/94 (BP Location: Right Arm)   Pulse 99   Temp 98.1 F (36.7 C)   Resp 17   Wt 71.7 kg   SpO2 99%   Breastfeeding No   BMI 34.19 kg/m  Gen:   Awake, no distress   Resp:  Normal effort  MSK:   Moves extremities without difficulty  Other:  Poor dentition throughout   Medical Decision Making  Medically screening exam initiated at 6:34 PM.  Appropriate orders placed.  [name] was informed that the remainder of the evaluation will be completed by another provider, this initial triage assessment does not replace that evaluation, and the importance of remaining in the ED until their evaluation is complete.     Gadiel John, PA-C 12/13/24 [phone]

## 2024-12-13 NOTE — ED Triage Notes (Signed)
 Patient here with complaints of left jaw and dental pain. She report that the pain has been going on for the past 7 days. She has swelling to the left side of her face. She is unable to eat or drink anything. She denies breaking a tooth and is unsure of what caused the pain and swelling.

## 2024-12-14 NOTE — ED Notes (Signed)
Pt left from lobby
